# Patient Record
Sex: Female | Born: 1970 | Race: White | Hispanic: Yes | Marital: Married | Smoking: Never smoker
Health system: Southern US, Community
[De-identification: ages and names within clinical notes are randomized; demographics above are authoritative.]

## PROBLEM LIST (undated history)

## (undated) DIAGNOSIS — I10 Essential (primary) hypertension: Secondary | ICD-10-CM

## (undated) DIAGNOSIS — D649 Anemia, unspecified: Secondary | ICD-10-CM

## (undated) DIAGNOSIS — R569 Unspecified convulsions: Secondary | ICD-10-CM

## (undated) DIAGNOSIS — O24419 Gestational diabetes mellitus in pregnancy, unspecified control: Secondary | ICD-10-CM

## (undated) HISTORY — PX: BREAST SURGERY: SHX581

## (undated) HISTORY — DX: Essential (primary) hypertension: I10

## (undated) HISTORY — PX: LIPOSUCTION: SHX10

## (undated) HISTORY — DX: Gestational diabetes mellitus in pregnancy, unspecified control: O24.419

---

## 2003-07-05 ENCOUNTER — Emergency Department (HOSPITAL_COMMUNITY): Admission: EM | Admit: 2003-07-05 | Discharge: 2003-07-05 | Payer: Self-pay | Admitting: *Deleted

## 2005-06-18 ENCOUNTER — Emergency Department (HOSPITAL_COMMUNITY): Admission: EM | Admit: 2005-06-18 | Discharge: 2005-06-18 | Payer: Self-pay | Admitting: Emergency Medicine

## 2006-02-21 ENCOUNTER — Emergency Department (HOSPITAL_COMMUNITY): Admission: EM | Admit: 2006-02-21 | Discharge: 2006-02-22 | Payer: Self-pay | Admitting: Emergency Medicine

## 2006-08-10 ENCOUNTER — Emergency Department (HOSPITAL_COMMUNITY): Admission: EM | Admit: 2006-08-10 | Discharge: 2006-08-10 | Payer: Self-pay | Admitting: Emergency Medicine

## 2008-09-10 ENCOUNTER — Emergency Department (HOSPITAL_COMMUNITY): Admission: EM | Admit: 2008-09-10 | Discharge: 2008-09-10 | Payer: Self-pay | Admitting: Emergency Medicine

## 2009-12-24 ENCOUNTER — Other Ambulatory Visit: Admission: RE | Admit: 2009-12-24 | Discharge: 2009-12-24 | Payer: Self-pay | Admitting: Gynecology

## 2009-12-24 ENCOUNTER — Ambulatory Visit: Payer: Self-pay | Admitting: Gynecology

## 2009-12-29 ENCOUNTER — Encounter: Admission: RE | Admit: 2009-12-29 | Discharge: 2009-12-29 | Payer: Self-pay | Admitting: Gynecology

## 2010-09-16 LAB — POCT I-STAT, CHEM 8
BUN: 17 mg/dL (ref 6–23)
Calcium, Ion: 1.19 mmol/L (ref 1.12–1.32)
Chloride: 103 mEq/L (ref 96–112)
Creatinine, Ser: 0.8 mg/dL (ref 0.4–1.2)
Glucose, Bld: 104 mg/dL — ABNORMAL HIGH (ref 70–99)
HCT: 39 % (ref 36.0–46.0)
Hemoglobin: 13.3 g/dL (ref 12.0–15.0)
Potassium: 4 mEq/L (ref 3.5–5.1)
Sodium: 136 mEq/L (ref 135–145)
TCO2: 25 mmol/L (ref 0–100)

## 2010-09-29 ENCOUNTER — Emergency Department (HOSPITAL_COMMUNITY)
Admission: EM | Admit: 2010-09-29 | Discharge: 2010-09-29 | Disposition: A | Discharge status: Home / Self Care | Payer: No Typology Code available for payment source | Attending: Emergency Medicine | Admitting: Emergency Medicine

## 2010-09-29 DIAGNOSIS — X58XXXA Exposure to other specified factors, initial encounter: Secondary | ICD-10-CM | POA: Insufficient documentation

## 2010-09-29 DIAGNOSIS — H02849 Edema of unspecified eye, unspecified eyelid: Secondary | ICD-10-CM | POA: Insufficient documentation

## 2010-09-29 DIAGNOSIS — T7840XA Allergy, unspecified, initial encounter: Secondary | ICD-10-CM | POA: Insufficient documentation

## 2010-09-29 DIAGNOSIS — L500 Allergic urticaria: Secondary | ICD-10-CM | POA: Insufficient documentation

## 2010-09-30 ENCOUNTER — Emergency Department (HOSPITAL_BASED_OUTPATIENT_CLINIC_OR_DEPARTMENT_OTHER)
Admission: EM | Admit: 2010-09-30 | Discharge: 2010-10-01 | Disposition: A | Discharge status: Home / Self Care | Payer: No Typology Code available for payment source | Attending: Emergency Medicine | Admitting: Emergency Medicine

## 2010-09-30 DIAGNOSIS — R21 Rash and other nonspecific skin eruption: Secondary | ICD-10-CM | POA: Insufficient documentation

## 2010-09-30 DIAGNOSIS — Z79899 Other long term (current) drug therapy: Secondary | ICD-10-CM | POA: Insufficient documentation

## 2011-01-25 ENCOUNTER — Other Ambulatory Visit (HOSPITAL_COMMUNITY)
Admission: RE | Admit: 2011-01-25 | Discharge: 2011-01-25 | Disposition: A | Discharge status: Home / Self Care | Payer: No Typology Code available for payment source | Source: Ambulatory Visit | Attending: Gynecology | Admitting: Gynecology

## 2011-01-25 ENCOUNTER — Encounter: Payer: Self-pay | Admitting: Gynecology

## 2011-01-25 ENCOUNTER — Ambulatory Visit (INDEPENDENT_AMBULATORY_CARE_PROVIDER_SITE_OTHER): Payer: No Typology Code available for payment source | Admitting: Gynecology

## 2011-01-25 VITALS — BP 134/90 | Ht 61.0 in | Wt 202.0 lb

## 2011-01-25 DIAGNOSIS — N393 Stress incontinence (female) (male): Secondary | ICD-10-CM | POA: Insufficient documentation

## 2011-01-25 DIAGNOSIS — F32A Depression, unspecified: Secondary | ICD-10-CM | POA: Insufficient documentation

## 2011-01-25 DIAGNOSIS — F3289 Other specified depressive episodes: Secondary | ICD-10-CM

## 2011-01-25 DIAGNOSIS — Z01419 Encounter for gynecological examination (general) (routine) without abnormal findings: Secondary | ICD-10-CM | POA: Insufficient documentation

## 2011-01-25 DIAGNOSIS — I10 Essential (primary) hypertension: Secondary | ICD-10-CM | POA: Insufficient documentation

## 2011-01-25 DIAGNOSIS — F419 Anxiety disorder, unspecified: Secondary | ICD-10-CM | POA: Insufficient documentation

## 2011-01-25 DIAGNOSIS — F411 Generalized anxiety disorder: Secondary | ICD-10-CM

## 2011-01-25 DIAGNOSIS — F329 Major depressive disorder, single episode, unspecified: Secondary | ICD-10-CM | POA: Insufficient documentation

## 2011-01-25 MED ORDER — PAROXETINE HCL 20 MG PO TABS: 20.0000 mg | ORAL_TABLET | ORAL | Status: DC

## 2011-01-25 MED ORDER — HYDROCHLOROTHIAZIDE 12.5 MG PO CAPS: 25.0000 mg | ORAL_CAPSULE | ORAL | Status: DC

## 2011-01-25 NOTE — Progress Notes (Signed)
Alison Collins 04/07/1971 [id number]   History:    40 y.o.  for annual exam with several complaints today. She had been followed by Dr. Yetta Barre for her hypertension and had labs drawn approximately 6 months ago. When I saw patient last year she was found to be hypertensive and I started her on HCTZ 12.5 mg daily. She stated that Dr. Yetta Barre had placed her on another antihypertensive agent that cost her to be very sleepy and she is not taking this medication or followed up with him. Her mammogram was in July of last year and she is in the process of scheduling her mammogram the next few weeks and she states that she does her monthly self breast examinations. Her other main complaint was stress urinary incontinence when she coughs sneezes or jumps. She denies any nocturia. Her other complaint was decreased libido.  Past medical history,surgical history, family history and social history were all reviewed and documented in the EPIC chart. ROS:  Was performed and pertinent positives and negatives are included in the history.  Exam: chaperone present Filed Vitals:   01/25/11 1612  BP: 134/90   Body mass index is 38.17 kg/(m^2).  General appearance : Well developed well nourished female. Skin grossly normal HEENT: Neck supple, trachea midline Lungs: Clear to auscultation, no rhonchi or wheezes Heart: Regular rate and rhythm, no murmurs or gallops Breast:Examined in sitting and supine position were symmetrical in appearance, no palpable masses, to skin retraction, no nipple inversion, no nipple discharge and no axillary or supraclavicular lymphadenopathy Abdomen: no palpable masses or tenderness Pelvic  Ext/BUS/vagina  normal   Cervix  normal   Uterus  anteverted, normal size, shape and contour, midline and mobile nontender   Adnexa  Without masses or tenderness  Anus and perineum  normal   Rectovaginal  normal sphincter tone without palpated masses or tenderness             Hemoccult not done      Assessment/Plan:  40 y.o. female for annual exam was main complaint today was stress urinary incontinence. We did a q. tip angle tests which was greater than 30 and on Valsalva maneuver she leaked urine in the supine position. There was no evidence of cystocele or rectocele or uterine prolapse. Her only child was delivered vaginally and according to her was for large for gestational age and difficult birth. We will have her return to the office in one to 2 weeks for a urodynamic evaluation. Prescription refill for her Paxil was provided to help her with her anxiety and depression which she states is were tremendously. I'm going to increase her HCTZ from 12.5-25 mg daily and we'll refer her to an internist Dr. Porfirio Oar since she sits states it's too far to drive to see Dr. Yetta Barre. She will be scheduled her mammogram which is overdue. I have encouraged her to exercise 45 minutes to an hour 3 times a week and to take a look at her diet which should be low in fat high in protein, fiber, fruits, and vegetables. For decreased libido she may purchase over-the-counter Zestra which she can apply on the clitoris when necessary. Her Pap smear was done today and we'll follow accordingly.    [name] MD, 5:20 PM 01/25/2011

## 2011-01-25 NOTE — Patient Instructions (Signed)
Nettie Elm, I'm going to refer you to my colleague Dr. Porfirio Oar who is in excellent internus to fall you for your hypertension. I have increased year HCTZ from 12.5 to 25 mg daily. He should be engage in some form of exercise 30-45 minutes 3-4 times a week and also your diet should  consists of a low-fat diet high in fiber high in protein high in fruits and vegetables. We are going to see you in one to 2 weeks to proceed with the urodynamic evaluation for your stress urinary incontinence to determine which is the best management to address your complaint. You may purchase over-the-counter clitoral stimulant such as Zestra which requires no prescription. I did call in your Paxil 20 mg that has worked well for your depression and anxiety. Once the urodynamic evaluation is completed I will refer you to a plastic surgeon for revision of your abdominoplasty/liposuction. No lab work was done today due to the fact the signature other primary physician had done on your labs in less than 6 months ago.

## 2011-03-02 ENCOUNTER — Ambulatory Visit: Payer: No Typology Code available for payment source | Admitting: Internal Medicine

## 2011-03-23 ENCOUNTER — Telehealth: Payer: Self-pay | Admitting: *Deleted

## 2011-03-23 NOTE — Telephone Encounter (Signed)
Lm for pt to call back to schedule urodynamics.

## 2011-04-01 ENCOUNTER — Ambulatory Visit: Payer: No Typology Code available for payment source | Admitting: Internal Medicine

## 2011-04-01 DIAGNOSIS — Z0289 Encounter for other administrative examinations: Secondary | ICD-10-CM

## 2011-04-13 NOTE — Telephone Encounter (Signed)
Pt never called back so therefore closed encounter

## 2011-07-01 ENCOUNTER — Ambulatory Visit: Payer: No Typology Code available for payment source | Admitting: Gynecology

## 2011-07-01 ENCOUNTER — Other Ambulatory Visit: Payer: Self-pay | Admitting: Gynecology

## 2011-07-01 DIAGNOSIS — Z1231 Encounter for screening mammogram for malignant neoplasm of breast: Secondary | ICD-10-CM

## 2011-07-14 ENCOUNTER — Ambulatory Visit
Admission: RE | Admit: 2011-07-14 | Discharge: 2011-07-14 | Disposition: A | Discharge status: Home / Self Care | Payer: No Typology Code available for payment source | Source: Ambulatory Visit | Attending: Gynecology | Admitting: Gynecology

## 2011-07-14 DIAGNOSIS — Z1231 Encounter for screening mammogram for malignant neoplasm of breast: Secondary | ICD-10-CM

## 2011-07-15 ENCOUNTER — Ambulatory Visit (INDEPENDENT_AMBULATORY_CARE_PROVIDER_SITE_OTHER): Payer: No Typology Code available for payment source | Admitting: Gynecology

## 2011-07-15 ENCOUNTER — Encounter: Payer: Self-pay | Admitting: Gynecology

## 2011-07-15 VITALS — BP 148/82

## 2011-07-15 DIAGNOSIS — N393 Stress incontinence (female) (male): Secondary | ICD-10-CM

## 2011-07-15 NOTE — Progress Notes (Signed)
Patient is a 41 year old gravida 3 para 1 AB 2 that presented to the office today with complaining of worsening stress urinary incontinence. She denies any nocturia. She does have frequency but she does drink lots of fluid and she is on HCTZ for hypertension. She is also taking Prozac which has helped her tremendously. She is overweight. She states that when she coughs or sneezes she leaks urine especially if she hasn't voided. She doesn't have any urgency.  Exam: Abdomen: Soft nontender pendulous no rebound or guarding Pelvic: Bartholin urethra Skene was within normal limits Vagina: No lesions or discharge no cystocele or rectocele Uterus: Slightly anteverted normal size shape and consistency Adnexa: No palpable masses or tenderness Rectal: Not examined  Q-tip angle test greater than 30. No evidence of cystocele rectocele. Positive stress test on Valsalva with leakage of urine. Patient was examined in sitting and supine position good support otherwise.  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 Literature information on stress urinary incontinence was provided today. All questions answered we'll follow accordingly.

## 2011-07-15 NOTE — Patient Instructions (Signed)
Will call you with appointment for urodynamic study

## 2011-08-03 ENCOUNTER — Ambulatory Visit: Payer: No Typology Code available for payment source

## 2011-08-10 ENCOUNTER — Ambulatory Visit (INDEPENDENT_AMBULATORY_CARE_PROVIDER_SITE_OTHER): Payer: No Typology Code available for payment source | Admitting: *Deleted

## 2011-08-10 DIAGNOSIS — N3941 Urge incontinence: Secondary | ICD-10-CM

## 2011-08-10 DIAGNOSIS — N393 Stress incontinence (female) (male): Secondary | ICD-10-CM

## 2011-08-17 ENCOUNTER — Ambulatory Visit: Payer: No Typology Code available for payment source | Admitting: Gynecology

## 2011-08-24 ENCOUNTER — Encounter: Payer: Self-pay | Admitting: Gynecology

## 2011-08-24 ENCOUNTER — Telehealth: Payer: Self-pay | Admitting: *Deleted

## 2011-08-24 ENCOUNTER — Ambulatory Visit (INDEPENDENT_AMBULATORY_CARE_PROVIDER_SITE_OTHER): Payer: No Typology Code available for payment source | Admitting: Gynecology

## 2011-08-24 VITALS — BP 144/88

## 2011-08-24 DIAGNOSIS — N393 Stress incontinence (female) (male): Secondary | ICD-10-CM

## 2011-08-24 NOTE — Telephone Encounter (Signed)
Lm for patient to call.  Need to inform patient [facility] said they don't accept her insurance, but could file for her and she may be billed for it if they don't pay.  Need to get ok from patient before scheduling appt.

## 2011-08-24 NOTE — Progress Notes (Signed)
Patient is a 41 year old gravida 3 para 1 AB 2 who was seen in the office on February 7 for her annual gynecological examination and had voiced her concern about her worsening stress urinary incontinence. She denied any urgency or any nocturia. She's currently on HCTZ for hypertension. She states that when she coughs or sneeze she'll leak urine. Patient had a urodynamic evaluation here in our office on March 5 with the following results:  Complex uroflowmetry was normal and demonstrated no evidence of obstructive uropathy. Her post void residual was normal at 40 cc. Neither genuine stress urinary incontinence nor uninhibited detrusor activity was noted during a multichannel CMG. 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 Leak point pressures were at 167, 109, and 163 cm of water. The patient's cystometric bladder capacity was normal as was her bladder compliance. Maximum urethral closure pressures were within normal range as follows 50, 34 and 43 cm of water. Which were within normal range and failed to demonstrate intrinsic sphincter deficiency. 3 channel CMG with flow was within normal limits.  Case was discussed with Dr. Patsi Sears (urologist) who agrees that patient stress urinary incontinence will be improved with the Linx retropubic sling procedure. Patient has no cystocele or rectocele or uterine prolapse. Since patient is very active and does weight lifting this form of anti-incontinence procedure would be best for her and he would be able to perform it. An appointment will be set up for her to see him preoperatively. We will send him a copy of this note as well as a urodynamic studies. Literature information was provided.

## 2011-08-24 NOTE — Patient Instructions (Signed)
Urinary Incontinence Your doctor wants you to have this information about urinary incontinence. This is the inability to keep urine in your body until you decide to release it. CAUSES  Prostate gland enlargement is a common cause of urinary incontinence. But there are many different causes for losing urinary control. They include:  Medicines.   Infections.   Prostate problems.   Surgery.   Neurological diseases.   Emotional factors.  DIAGNOSIS  Evaluating the cause of incontinence is important in choosing the best treatment. This may require:  An ultrasound exam.   Kidney and bladder X-rays.   Cystoscopy. This is an exam of the bladder using a narrow scope.  TREATMENT  For incontinent patients, normal daily hygiene and using changing pads or adult diapers regularly will prevent offensive odors and skin damage from the moisture. Changing your medicines may help control incontinence. Your caregiver may prescribe some medicines to help you regain control. Avoid caffeine. It can over-stimulate the bladder. Use the bathroom regularly. Try about every 2 to 3 hours even if you do not feel the need. Take time to empty your bladder completely. After urinating, wait a minute. Then try to urinate again. External devices used to catch urine or an indwelling urine catheter (Foley catheter) may be needed as well. Some prostate gland problems require surgery to correct. Call your caregiver for more information. Document Released: 07/01/2004 Document Revised: 05/13/2011 Document Reviewed: 06/26/2008 [facility] Patient Information 2012 [place], [place].  Urethral Vaginal Sling A urethral vaginal sling can be done to correct urinary incontinence (UI). Urinary incontinence is uncontrolled loss of urine. It is very common in both women who have had children and in aging women. The purpose of the "urethral vaginal sling" is to place a strong piece of material (i.e., tension-free vaginal tape or nylon mesh)  that fits under the urethra like a hammock. The urethra is the tube that drains your bladder. This sling is put in position to straighten, support and hold the urethra in its normal position. This is a common surgical procedure for this problem. LET YOUR CAREGIVER KNOW ABOUT:   Any allergies to foods or medications.   All the medications you are taking. This includes over-the-counter drugs, herbs, eye drops, creams and steroids.   Use of illegal drugs.   Smoking or heavy alcohol drinking.   Past problems with anesthetics.   Possibility of being pregnant.   History of blood clots or other blood problems.   History of bleeding problems.   Past surgery.   Other medical or health problems.  RISKS AND COMPLICATIONS   Infection.   Excessive bleeding.   Damage to other organs.   Problems urinating properly for several days or weeks.   Problems from the anesthesia.   The UI can come back.  BEFORE THE PROCEDURE   Do not take aspirin or blood thinners a week before the surgery unless you are told otherwise.   Do not eat or drink anything 8 hours before the surgery.   If you smoke, do not smoke at least two weeks before the surgery.   Let your caregiver know if you get a cold or infection before your surgery.   If you will be admitted the same day as the surgery, get to the hospital at least one hour before the surgery.   Arrange for someone to take you home from the hospital and for help at home.  AFTER THE PROCEDURE   You will be taken to recovery area where nurses  will monitor your progress and vital signs (blood pressure, pulse, breathing, temperature). They will move you to your room when you are stable.   You will have a catheter in your bladder until you can urinate properly.   You may have a gauze packing in the vagina to prevent bleeding. This will be removed in one to two days.   You are usually in the hospital 2 to 3 days.  HOME CARE INSTRUCTIONS   Take  showers instead of baths until you are informed otherwise.   Resume your usual diet.   Get rest and sleep.   Try to have someone at home for 2 to 3 weeks to help you with your household activities.   Do not lift anything over 5 pounds.   Only take over-the-counter or prescription medicines for pain, discomfort or fever as directed by your caregiver. Do not take aspirin because it can cause bleeding.   Do not douche, exercise, use tampons or have sexual intercourse until your caregiver gives you permission.  SEEK MEDICAL CARE IF:   You have a heavy or bad smelling vaginal discharge.   You develop a rash.   You need stronger pain medication.   You are having side effects from your medications.   You develop lightheadedness or feel faint.  SEEK IMMEDIATE MEDICAL CARE IF:   You develop a temperature of 100 F (37.8 C) or higher.   You have vaginal bleeding.   You faint or pass out.   You develop shortness of breath.   You develop chest, abdominal or leg pain.   You develop pain when urinating or cannot urinate.   Your catheter is still in your bladder and it blocks up.   You develop swelling, redness and pain in the vaginal area.  Document Released: 03/02/2008 Document Revised: 05/13/2011 Document Reviewed: 03/02/2008 [facility] Patient Information 2012 [place], [place].

## 2011-08-24 NOTE — Telephone Encounter (Signed)
Message copied by Libby Maw on Tue Aug 24, 2011  3:17 PM ------      Message from: [name]      Created: Tue Aug 24, 2011  2:08 PM       Shae Hinnenkamp, I spoke with Dr. Patsi Sears this morning and he says he would like you to call his nurse Selena Batten to schedule a consult. Patient diagnoses stress urinary incontinence will need a retropubic sling procedure. See last encounter from today. Thank you

## 2011-08-27 NOTE — Telephone Encounter (Signed)
Lm for patient to call

## 2011-09-08 ENCOUNTER — Encounter: Payer: Self-pay | Admitting: *Deleted

## 2011-10-19 NOTE — Telephone Encounter (Signed)
Patient never responded to letter sent on 09/08/11.

## 2012-02-07 ENCOUNTER — Other Ambulatory Visit: Payer: Self-pay | Admitting: Gynecology

## 2012-12-06 ENCOUNTER — Encounter (HOSPITAL_COMMUNITY): Payer: Self-pay | Admitting: Emergency Medicine

## 2012-12-06 ENCOUNTER — Emergency Department (HOSPITAL_COMMUNITY)
Admission: EM | Admit: 2012-12-06 | Discharge: 2012-12-07 | Disposition: A | Discharge status: Home / Self Care | Payer: Self-pay | Attending: Emergency Medicine | Admitting: Emergency Medicine

## 2012-12-06 DIAGNOSIS — H109 Unspecified conjunctivitis: Secondary | ICD-10-CM | POA: Insufficient documentation

## 2012-12-06 DIAGNOSIS — H539 Unspecified visual disturbance: Secondary | ICD-10-CM | POA: Insufficient documentation

## 2012-12-06 DIAGNOSIS — H5713 Ocular pain, bilateral: Secondary | ICD-10-CM

## 2012-12-06 DIAGNOSIS — Z880 Allergy status to penicillin: Secondary | ICD-10-CM | POA: Insufficient documentation

## 2012-12-06 DIAGNOSIS — H5710 Ocular pain, unspecified eye: Secondary | ICD-10-CM | POA: Insufficient documentation

## 2012-12-06 DIAGNOSIS — Z8632 Personal history of gestational diabetes: Secondary | ICD-10-CM | POA: Insufficient documentation

## 2012-12-06 DIAGNOSIS — I10 Essential (primary) hypertension: Secondary | ICD-10-CM | POA: Insufficient documentation

## 2012-12-06 DIAGNOSIS — L299 Pruritus, unspecified: Secondary | ICD-10-CM | POA: Insufficient documentation

## 2012-12-06 DIAGNOSIS — Z8679 Personal history of other diseases of the circulatory system: Secondary | ICD-10-CM | POA: Insufficient documentation

## 2012-12-06 MED ORDER — FLUORESCEIN SODIUM 1 MG OP STRP
1.0000 | ORAL_STRIP | Freq: Once | OPHTHALMIC | Status: AC
  Administered 2012-12-06: 1 via OPHTHALMIC
  Filled 2012-12-06 (×2): qty 1

## 2012-12-06 MED ORDER — TETRACAINE HCL 0.5 % OP SOLN
2.0000 [drp] | Freq: Once | OPHTHALMIC | Status: AC
  Administered 2012-12-06: 2 [drp] via OPHTHALMIC
  Filled 2012-12-06: qty 2

## 2012-12-06 NOTE — ED Provider Notes (Signed)
This chart was scribed for Alison Collins, a non-physician practitioner working with Olivia Mackie, MD by Lewanda Rife, ED Scribe. This patient was seen in room TR04C/TR04C and the patient's care was started at 2332.     History    CSN: [id number] Arrival date & time 12/06/12  2319  First MD Initiated Contact with Patient 12/06/12 2329     Chief Complaint  Patient presents with  . Eye Drainage   The history is provided by the patient.   HPI Comments: Levern Kalka is a 42 y.o. female who presents to the Emergency Department complaining of worsening constant bilateral eye irritation onset 1 week. Reports associated moderate constant pain redness, teary, "spotty vision" and itching. Reports she does wear make up, but stopped using it all together with no relief of symptoms. Denies associated fever, photophobia, purulent drainage, congestion, sick contacts at home, diplopia, rhinorrhea, and otalgia. 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 Reports trying OTC Visine season itching , Zyrtec, and Zaditor (antihistimine eye drops) with no relief of symptoms. Reports she does not wear eye contacts. Reports hx of seasonal allergies. Denies significant PMH.  Past Medical History  Diagnosis Date  . GDM (gestational diabetes mellitus)   . Normal spontaneous vaginal delivery   . Spontaneous abortion     2  . Hypertension   . Migraine    Past Surgical History  Procedure Laterality Date  . Liposuction     Family History  Problem Relation Age of Onset  . Hypertension Mother   . Heart disease Father    History  Substance Use Topics  . Smoking status: Never Smoker   . Smokeless tobacco: Never Used  . Alcohol Use: Yes     Comment: OCCASIONALLY   OB History   Grav Para Term Preterm Abortions TAB SAB Ect Mult Living   3 1   2  2   1      Review of Systems  Constitutional: Negative for fever.  HENT: Negative for rhinorrhea.   Eyes: Positive for pain,  discharge, redness, itching and visual disturbance. Negative for photophobia.  Allergic/Immunologic: Positive for environmental allergies.  A complete 10 system review of systems was obtained and all systems are negative except as noted in the HPI and PMH.     Allergies  Penicillins  Home Medications   Current Outpatient Rx  Name  Route  Sig  Dispense  Refill  . hydrochlorothiazide (,MICROZIDE/HYDRODIURIL,) 12.5 MG capsule   Oral   Take 2 capsules (25 mg total) by mouth every morning.   60 capsule   10   . PARoxetine (PAXIL) 20 MG tablet      TAKE 1 TABLET (20 MG TOTAL) BY MOUTH EVERY MORNING.   30 tablet   8    BP 153/81  Pulse 78  Temp(Src) 98.1 F (36.7 C) (Oral)  Resp 14  SpO2 98%  LMP 11/24/2012 Physical Exam  Nursing note and vitals reviewed. Constitutional: She is oriented to person, place, and time. She appears well-developed and well-nourished. No distress.  HENT:  Head: Normocephalic and atraumatic.  Eyes: EOM are normal. Pupils are equal, round, and reactive to light. Right eye exhibits no discharge and no exudate. Left eye exhibits no discharge and no exudate. Right conjunctiva is injected. Left conjunctiva is injected. No scleral icterus.  Slit lamp exam:      The right eye shows fluorescein uptake. The right eye shows no corneal abrasion, no corneal flare,  no corneal ulcer and no foreign body.       The left eye shows fluorescein uptake. The left eye shows no corneal abrasion, no corneal flare, no corneal ulcer and no foreign body.  Patient without photophobia. No photophobia with both direct and indirect lights.  Very small pinpoint areas of fluorescein uptake over the cornea bilaterally. No corneal abrasions or ulcerations. No dendritic branchings.  Neck: Neck supple. No tracheal deviation present.  Cardiovascular: Normal rate.   Pulmonary/Chest: Effort normal. No respiratory distress.  Musculoskeletal: Normal range of motion.  Neurological: She is  alert and oriented to person, place, and time.  Skin: Skin is warm and dry.  Psychiatric: She has a normal mood and affect. Her behavior is normal.    ED Course  Procedures     1. Eye pain, bilateral   2. Conjunctivitis     MDM  Patient seen and evaluated. Patient appears well in no acute distress.  Patient with some symptoms of itchiness and irritation possibly consistent with allergic conjunctivitis however she is not having any good relief with Zyrtec and over-the-counter allergy eyedrops. There are small pinpoint areas of fluorescein uptake over the corneas bilaterally. Maybe some concerns for additional infection. She does not seem to have significant photophobia consistent with iritis. We'll plan to provide antibiotic eye drop and give ophthalmology referral.    I personally performed the services described in this documentation, which was scribed in my presence. The recorded information has been reviewed and is accurate.   Angus Seller, PA-C 12/07/12 [phone]

## 2012-12-06 NOTE — ED Notes (Signed)
PT. REPORTS PERSISTENT BILATERAL EYE IRRITATION WITH REDDNESS/ WATERY / ITCHY EYES FOR 1 WEEK .

## 2012-12-07 MED ORDER — HYDROCODONE-ACETAMINOPHEN 5-325 MG PO TABS
1.0000 | ORAL_TABLET | Freq: Once | ORAL | Status: AC
  Administered 2012-12-07: 1 via ORAL
  Filled 2012-12-07: qty 1

## 2012-12-07 MED ORDER — HYDROCODONE-ACETAMINOPHEN 5-325 MG PO TABS: 1.0000 | ORAL_TABLET | ORAL | Status: DC | PRN

## 2012-12-07 MED ORDER — TOBRAMYCIN 0.3 % OP SOLN
2.0000 [drp] | OPHTHALMIC | Status: DC
  Administered 2012-12-07: 2 [drp] via OPHTHALMIC
  Filled 2012-12-07: qty 5

## 2012-12-07 NOTE — ED Notes (Signed)
Pt comfortable with d/c and f/u instructions. Prescriptions x1 

## 2012-12-07 NOTE — ED Provider Notes (Signed)
Medical screening examination/treatment/procedure(s) were performed by non-physician practitioner and as supervising physician I was immediately available for consultation/collaboration.  Zephyr Ridley M Kori Colin, MD 12/07/12 0412 

## 2012-12-11 ENCOUNTER — Emergency Department (INDEPENDENT_AMBULATORY_CARE_PROVIDER_SITE_OTHER)
Admission: EM | Admit: 2012-12-11 | Discharge: 2012-12-11 | Disposition: A | Discharge status: Home / Self Care | Payer: Self-pay | Source: Home / Self Care | Attending: Emergency Medicine | Admitting: Emergency Medicine

## 2012-12-11 ENCOUNTER — Encounter (HOSPITAL_COMMUNITY): Payer: Self-pay | Admitting: Emergency Medicine

## 2012-12-11 DIAGNOSIS — H10219 Acute toxic conjunctivitis, unspecified eye: Secondary | ICD-10-CM

## 2012-12-11 DIAGNOSIS — H10213 Acute toxic conjunctivitis, bilateral: Secondary | ICD-10-CM

## 2012-12-11 MED ORDER — KETOTIFEN FUMARATE 0.025 % OP SOLN: 1.0000 [drp] | Freq: Two times a day (BID) | OPHTHALMIC | Status: AC

## 2012-12-11 MED ORDER — TETRACAINE HCL 0.5 % OP SOLN
OPHTHALMIC | Status: AC
  Filled 2012-12-11: qty 2
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
## 2012-12-11 NOTE — ED Notes (Addendum)
Reports eyes hurting, vision blurry,  feeling something in eyes, and watery eyes that started one week ago.  Patient reports going to emergency department 2-3 days ago and receiving eye drops -tobramycin- reports using drops as instructed , but feeling no better.  Does not wear contacts, wears glasses with driving

## 2012-12-11 NOTE — ED Notes (Signed)
Eye box, tetracaine at bedside

## 2012-12-11 NOTE — ED Notes (Signed)
Discussed work note with dr Ladon Applebaum

## 2012-12-13 ENCOUNTER — Encounter (HOSPITAL_BASED_OUTPATIENT_CLINIC_OR_DEPARTMENT_OTHER): Payer: Self-pay | Admitting: Family Medicine

## 2012-12-13 ENCOUNTER — Emergency Department (HOSPITAL_BASED_OUTPATIENT_CLINIC_OR_DEPARTMENT_OTHER)
Admission: EM | Admit: 2012-12-13 | Discharge: 2012-12-13 | Disposition: A | Discharge status: Home / Self Care | Payer: Worker's Compensation | Attending: Emergency Medicine | Admitting: Emergency Medicine

## 2012-12-13 DIAGNOSIS — H10213 Acute toxic conjunctivitis, bilateral: Secondary | ICD-10-CM

## 2012-12-13 DIAGNOSIS — H5789 Other specified disorders of eye and adnexa: Secondary | ICD-10-CM | POA: Insufficient documentation

## 2012-12-13 DIAGNOSIS — Z8632 Personal history of gestational diabetes: Secondary | ICD-10-CM | POA: Insufficient documentation

## 2012-12-13 DIAGNOSIS — Z8679 Personal history of other diseases of the circulatory system: Secondary | ICD-10-CM | POA: Insufficient documentation

## 2012-12-13 DIAGNOSIS — H10219 Acute toxic conjunctivitis, unspecified eye: Secondary | ICD-10-CM | POA: Insufficient documentation

## 2012-12-13 DIAGNOSIS — I10 Essential (primary) hypertension: Secondary | ICD-10-CM | POA: Insufficient documentation

## 2012-12-13 DIAGNOSIS — Z880 Allergy status to penicillin: Secondary | ICD-10-CM | POA: Insufficient documentation

## 2012-12-13 NOTE — ED Notes (Signed)
Pt employer with pt and sts pt had to be brought to ED due to workers comp issue. Pt has not consulted with opthalmology.

## 2012-12-13 NOTE — ED Provider Notes (Signed)
History    CSN: [id number] Arrival date & time 12/13/12  1433  First MD Initiated Contact with Patient 12/13/12 1452     Chief Complaint  Patient presents with  . Eye Problem   (Consider location/radiation/quality/duration/timing/severity/associated sxs/prior Treatment) HPI  42 year old female presents to ED requesting for evaluations of eye pain and redness.  Patient reports 2 weeks ago she started to notice some irritation to both eyes while at work. 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 She was initially evaluated in the ED for the symptoms. Her exam reveals no evidence of corneal abrasion however patient was prescribed pain medication and antibiotic eyedrops. She continues to take the medication with improvement however (has not 4 resolved. She was seen at urgent care 2 days ago for the same complaints. She was diagnosed with chemical conjunctivitis and was giving pain medication eyedrop which has helped the symptoms. She was recommended to followup with an ophthalmologist if symptoms not fully resolved. She went to work today, however her eye are red, therefore she was suggested to come to the ED for reevaluation and also to filled out worker's comp.  Pt has not f/u with opthalmologist yet.  Sts her sxs has improved.  Denies any active eye pain or worsening vision.  Denies pressure behind eye, headache, n/v, or rash.  Denies recent trauma.   Past Medical History  Diagnosis Date  . GDM (gestational diabetes mellitus)   . Normal spontaneous vaginal delivery   . Spontaneous abortion     2  . Hypertension   . Migraine    Past Surgical History  Procedure Laterality Date  . Liposuction     Family History  Problem Relation Age of Onset  . Hypertension Mother   . Heart disease Father    History  Substance Use Topics  . Smoking status: Never Smoker   . Smokeless tobacco: Never Used  . Alcohol Use: Yes     Comment: OCCASIONALLY    OB History   Grav Para Term Preterm Abortions TAB SAB Ect Mult Living   3 1   2  2   1      Review of Systems  Constitutional: Negative for fever.  Eyes: Positive for redness. Negative for photophobia, pain, discharge, itching and visual disturbance.  Neurological: Negative for headaches.    Allergies  Penicillins  Home Medications   Current Outpatient Rx  Name  Route  Sig  Dispense  Refill  . HYDROcodone-acetaminophen (NORCO) 5-325 MG per tablet   Oral   Take 1 tablet by mouth every 4 (four) hours as needed for pain.   20 tablet   0   . ketotifen (ZADITOR) 0.025 % ophthalmic solution   Both Eyes   Place 1 drop into both eyes 2 (two) times daily.   5 mL   0    BP 168/95  Pulse 106  Temp(Src) 98.4 F (36.9 C) (Oral)  Resp 20  SpO2 96%  LMP 11/24/2012 Physical Exam  Nursing note and vitals reviewed. Constitutional: She is oriented to person, place, and time. She appears well-developed and well-nourished. No distress.  HENT:  Head: Atraumatic.  Eyes: EOM are normal. Pupils are equal, round, and reactive to light. Right eye exhibits no discharge. Left eye exhibits no discharge. No scleral icterus.  Bilateral eyes with injected conjunctiva.  PERRL, EOMI.  No fb seen or palpated.  Non tender on palpation.  No edema of eyelid  Neck: Neck supple.  Neurological: She is alert and oriented to person, place, and time.  Skin: Skin is warm. No rash noted.  Psychiatric: She has a normal mood and affect.    ED Course  Procedures (including critical care time)  3:27 PM Pt with injected conjunctiva bilaterally.  Visual acuity similar to 2 days ago.  Pt has had eye evaluation including woods lamp, fluorescein without acute finding.  Pt has also been prescribed abx and eye drops and sxs has improved.  She is here mainly due to worker's comp requirement.  Will obtain labs as requested and pt to f/u with opthalmologist for further care.   Pt otherwise stable for discharge.     Labs Reviewed - No data to display No results found. 1. Chemical conjunctivitis of both eyes     MDM  BP 168/95  Pulse 106  Temp(Src) 98.4 F (36.9 C) (Oral)  Resp 20  SpO2 96%  LMP 11/24/2012   Fayrene Helper, PA-C 12/13/12 4132

## 2012-12-14 NOTE — ED Provider Notes (Signed)
Medical screening examination/treatment/procedure(s) were performed by non-physician practitioner and as supervising physician I was immediately available for consultation/collaboration.   Apolinar Bero B. Bernette Mayers, MD 12/14/12 1104

## 2014-03-09 ENCOUNTER — Emergency Department (HOSPITAL_COMMUNITY)
Admission: EM | Admit: 2014-03-09 | Discharge: 2014-03-09 | Disposition: A | Discharge status: Home / Self Care | Payer: No Typology Code available for payment source | Attending: Emergency Medicine | Admitting: Emergency Medicine

## 2014-03-09 ENCOUNTER — Encounter (HOSPITAL_COMMUNITY): Payer: Self-pay | Admitting: Emergency Medicine

## 2014-03-09 DIAGNOSIS — Z880 Allergy status to penicillin: Secondary | ICD-10-CM | POA: Insufficient documentation

## 2014-03-09 DIAGNOSIS — Z8669 Personal history of other diseases of the nervous system and sense organs: Secondary | ICD-10-CM | POA: Insufficient documentation

## 2014-03-09 DIAGNOSIS — Y92410 Unspecified street and highway as the place of occurrence of the external cause: Secondary | ICD-10-CM | POA: Insufficient documentation

## 2014-03-09 DIAGNOSIS — S20212A Contusion of left front wall of thorax, initial encounter: Secondary | ICD-10-CM | POA: Insufficient documentation

## 2014-03-09 DIAGNOSIS — I10 Essential (primary) hypertension: Secondary | ICD-10-CM | POA: Diagnosis not present

## 2014-03-09 DIAGNOSIS — Z8632 Personal history of gestational diabetes: Secondary | ICD-10-CM | POA: Insufficient documentation

## 2014-03-09 DIAGNOSIS — Y9389 Activity, other specified: Secondary | ICD-10-CM | POA: Diagnosis not present

## 2014-03-09 DIAGNOSIS — S199XXA Unspecified injury of neck, initial encounter: Secondary | ICD-10-CM | POA: Diagnosis present

## 2014-03-09 DIAGNOSIS — S161XXA Strain of muscle, fascia and tendon at neck level, initial encounter: Secondary | ICD-10-CM | POA: Diagnosis not present

## 2014-03-09 MED ORDER — METHOCARBAMOL 500 MG PO TABS: 500.0000 mg | ORAL_TABLET | Freq: Two times a day (BID) | ORAL | Status: DC

## 2014-03-09 MED ORDER — IBUPROFEN 600 MG PO TABS: 600.0000 mg | ORAL_TABLET | Freq: Four times a day (QID) | ORAL | Status: DC | PRN

## 2014-03-09 MED ORDER — IBUPROFEN 200 MG PO TABS
600.0000 mg | ORAL_TABLET | Freq: Once | ORAL | Status: AC
  Administered 2014-03-09: 600 mg via ORAL
  Filled 2014-03-09: qty 3

## 2014-03-09 MED ORDER — METHOCARBAMOL 500 MG PO TABS
750.0000 mg | ORAL_TABLET | Freq: Once | ORAL | Status: AC
  Administered 2014-03-09: 750 mg via ORAL
  Filled 2014-03-09: qty 2

## 2014-03-09 NOTE — ED Provider Notes (Signed)
Medical screening examination/treatment/procedure(s) were performed by non-physician practitioner and as supervising physician I was immediately available for consultation/collaboration.   EKG Interpretation None        Vauda Salvucci, MD 03/09/14 2305 

## 2014-03-09 NOTE — Discharge Instructions (Signed)
Blunt Chest Trauma Blunt chest trauma is an injury caused by a blow to the chest. These chest injuries can be very painful. Blunt chest trauma often results in bruised or broken (fractured) ribs. Most cases of bruised and fractured ribs from blunt chest traumas get better after 1 to 3 weeks of rest and pain medicine. Often, the soft tissue in the chest wall is also injured, causing pain and bruising. Internal organs, such as the heart and lungs, may also be injured. Blunt chest trauma can lead to serious medical problems. This injury requires immediate medical care. CAUSES   Motor vehicle collisions.  Falls.  Physical violence.  Sports injuries. SYMPTOMS   Chest pain. The pain may be worse when you move or breathe deeply.  Shortness of breath.  Lightheadedness.  Bruising.  Tenderness.  Swelling. DIAGNOSIS  Your caregiver will do a physical exam. X-rays may be taken to look for fractures. However, minor rib fractures may not show up on X-rays until a few days after the injury. If a more serious injury is suspected, further imaging tests may be done. This may include ultrasounds, computed tomography (CT) scans, or magnetic resonance imaging (MRI). TREATMENT  Treatment depends on the severity of your injury. Your caregiver may prescribe pain medicines and deep breathing exercises. HOME CARE INSTRUCTIONS  Limit your activities until you can move around without much pain.  Do not do any strenuous work until your injury is healed.  Put ice on the injured area.  Put ice in a plastic bag.  Place a towel between your skin and the bag.  Leave the ice on for 15-20 minutes, 03-04 times a day.  You may wear a rib belt as directed by your caregiver to reduce pain.  Practice deep breathing as directed by your caregiver to keep your lungs clear.  Only take over-the-counter or prescription medicines for pain, fever, or discomfort as directed by your caregiver. SEEK IMMEDIATE MEDICAL  CARE IF:   You have increasing pain or shortness of breath.  You cough up blood.  You have nausea, vomiting, or abdominal pain.  You have a fever.  You feel dizzy, weak, or you faint. MAKE SURE YOU:  Understand these instructions.  Will watch your condition.  Will get help right away if you are not doing well or get worse. Document Released: 07/01/2004 Document Revised: 08/16/2011 Document Reviewed: 03/10/2011 [facility] Patient Information 2015 [place], [place]. This information is not intended to replace advice given to you by your health care provider. Make sure you discuss any questions you have with your health care provider.  Cervical Sprain A cervical sprain is when the tissues (ligaments) that hold the neck bones in place stretch or tear. HOME CARE   Put ice on the injured area.  Put ice in a plastic bag.  Place a towel between your skin and the bag.  Leave the ice on for 15-20 minutes, 3-4 times a day.  You may have been given a collar to wear. This collar keeps your neck from moving while you heal.  Do not take the collar off unless told by your doctor.  If you have long hair, keep it outside of the collar.  Ask your doctor before changing the position of your collar. You may need to change its position over time to make it more comfortable.  If you are allowed to take off the collar for cleaning or bathing, follow your doctor's instructions on how to do it safely.  Keep your collar  clean by wiping it with mild soap and water. Dry it completely. If the collar has removable pads, remove them every 1-2 days to hand wash them with soap and water. Allow them to air dry. They should be dry before you wear them in the collar.  Do not drive while wearing the collar.  Only take medicine as told by your doctor.  Keep all doctor visits as told.  Keep all physical therapy visits as told.  Adjust your work station so that you have good posture while you work.  Avoid  positions and activities that make your problems worse.  Warm up and stretch before being active. GET HELP IF:  Your pain is not controlled with medicine.  You cannot take less pain medicine over time as planned.  Your activity level does not improve as expected. GET HELP RIGHT AWAY IF:   You are bleeding.  Your stomach is upset.  You have an allergic reaction to your medicine.  You develop new problems that you cannot explain.  You lose feeling (become numb) or you cannot move any part of your body (paralysis).  You have tingling or weakness in any part of your body.  Your symptoms get worse. Symptoms include:  Pain, soreness, stiffness, puffiness (swelling), or a burning feeling in your neck.  Pain when your neck is touched.  Shoulder or upper back pain.  Limited ability to move your neck.  Headache.  Dizziness.  Your hands or arms feel week, lose feeling, or tingle.  Muscle spasms.  Difficulty swallowing or chewing. MAKE SURE YOU:   Understand these instructions.  Will watch your condition.  Will get help right away if you are not doing well or get worse. Document Released: 11/10/2007 Document Revised: 01/24/2013 Document Reviewed: 11/29/2012 [facility] Patient Information 2015 [place]. This information is not intended to replace advice given to you by your health care provider. Make sure you discuss any questions you have with your health care provider. You can expect to be sore of the next several days to a week  Please take the prescribed medication as directed If you develop new symptoms or your present symptoms become unmanageable at home please return for further evaluation
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
## 2014-03-12 ENCOUNTER — Encounter (HOSPITAL_COMMUNITY): Payer: Self-pay | Admitting: Emergency Medicine

## 2014-03-12 ENCOUNTER — Emergency Department (HOSPITAL_COMMUNITY)
Admission: EM | Admit: 2014-03-12 | Discharge: 2014-03-12 | Disposition: A | Discharge status: Home / Self Care | Payer: No Typology Code available for payment source | Attending: Emergency Medicine | Admitting: Emergency Medicine

## 2014-03-12 ENCOUNTER — Emergency Department (HOSPITAL_COMMUNITY): Payer: No Typology Code available for payment source

## 2014-03-12 DIAGNOSIS — S138XXA Sprain of joints and ligaments of other parts of neck, initial encounter: Secondary | ICD-10-CM | POA: Diagnosis not present

## 2014-03-12 DIAGNOSIS — S134XXA Sprain of ligaments of cervical spine, initial encounter: Secondary | ICD-10-CM

## 2014-03-12 DIAGNOSIS — Y9289 Other specified places as the place of occurrence of the external cause: Secondary | ICD-10-CM | POA: Diagnosis not present

## 2014-03-12 DIAGNOSIS — Z880 Allergy status to penicillin: Secondary | ICD-10-CM | POA: Diagnosis not present

## 2014-03-12 DIAGNOSIS — Z79899 Other long term (current) drug therapy: Secondary | ICD-10-CM | POA: Insufficient documentation

## 2014-03-12 DIAGNOSIS — S3982XA Other specified injuries of lower back, initial encounter: Secondary | ICD-10-CM | POA: Diagnosis not present

## 2014-03-12 DIAGNOSIS — I10 Essential (primary) hypertension: Secondary | ICD-10-CM | POA: Insufficient documentation

## 2014-03-12 DIAGNOSIS — S199XXD Unspecified injury of neck, subsequent encounter: Secondary | ICD-10-CM | POA: Diagnosis present

## 2014-03-12 DIAGNOSIS — Y92410 Unspecified street and highway as the place of occurrence of the external cause: Secondary | ICD-10-CM | POA: Diagnosis not present

## 2014-03-12 DIAGNOSIS — Z8632 Personal history of gestational diabetes: Secondary | ICD-10-CM | POA: Insufficient documentation

## 2014-03-12 MED ORDER — HYDROCODONE-ACETAMINOPHEN 5-325 MG PO TABS: 1.0000 | ORAL_TABLET | Freq: Four times a day (QID) | ORAL | Status: DC | PRN

## 2014-03-12 NOTE — Discharge Instructions (Signed)
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
## 2014-03-12 NOTE — ED Notes (Signed)
Resident at bedside.  

## 2014-03-13 NOTE — ED Provider Notes (Signed)
Medical screening examination/treatment/procedure(s) were conducted as a shared visit with resident physician and myself.  I personally evaluated the patient during the encounter.   I interviewed and examined the patient. Lungs are CTAB. Cardiac exam wnl. Abdomen soft.  Imaging non-contrib. Pt would like to wear [place] collar for comfort. Will rec f/u w/ her pcp.   Purvis SheffieldForrest Peggy Monk, MD 03/13/14 1157

## 2014-04-08 ENCOUNTER — Encounter (HOSPITAL_COMMUNITY): Payer: Self-pay | Admitting: Emergency Medicine

## 2014-07-26 ENCOUNTER — Emergency Department (HOSPITAL_COMMUNITY)
Admission: EM | Admit: 2014-07-26 | Discharge: 2014-07-26 | Disposition: A | Discharge status: Home / Self Care | Payer: No Typology Code available for payment source | Attending: Emergency Medicine | Admitting: Emergency Medicine

## 2014-07-26 ENCOUNTER — Encounter (HOSPITAL_COMMUNITY): Payer: Self-pay | Admitting: Emergency Medicine

## 2014-07-26 DIAGNOSIS — Z79899 Other long term (current) drug therapy: Secondary | ICD-10-CM | POA: Insufficient documentation

## 2014-07-26 DIAGNOSIS — I10 Essential (primary) hypertension: Secondary | ICD-10-CM | POA: Insufficient documentation

## 2014-07-26 DIAGNOSIS — Z880 Allergy status to penicillin: Secondary | ICD-10-CM | POA: Insufficient documentation

## 2014-07-26 DIAGNOSIS — H109 Unspecified conjunctivitis: Secondary | ICD-10-CM | POA: Insufficient documentation

## 2014-07-26 DIAGNOSIS — Z8632 Personal history of gestational diabetes: Secondary | ICD-10-CM | POA: Insufficient documentation

## 2014-07-26 MED ORDER — TETRACAINE HCL 0.5 % OP SOLN
2.0000 [drp] | Freq: Once | OPHTHALMIC | Status: AC
  Administered 2014-07-26: 2 [drp] via OPHTHALMIC
  Filled 2014-07-26: qty 2

## 2014-07-26 MED ORDER — PREDNISOLONE ACETATE 1 % OP SUSP: 2.0000 [drp] | Freq: Four times a day (QID) | OPHTHALMIC | Status: DC

## 2014-07-26 MED ORDER — LORATADINE 10 MG PO TABS: 10.0000 mg | ORAL_TABLET | Freq: Every day | ORAL | Status: DC

## 2014-07-26 MED ORDER — FLUORESCEIN SODIUM 1 MG OP STRP
1.0000 | ORAL_STRIP | Freq: Once | OPHTHALMIC | Status: AC
  Administered 2014-07-26: 1 via OPHTHALMIC
  Filled 2014-07-26: qty 1

## 2014-07-26 MED ORDER — ERYTHROMYCIN 5 MG/GM OP OINT: 1.0000 "application " | TOPICAL_OINTMENT | Freq: Four times a day (QID) | OPHTHALMIC | Status: DC

## 2014-07-26 NOTE — ED Notes (Signed)
Pt states that 2 weeks ago eyes started getting a little red and burn but gotten worse. Pt states that she had the same problem last year and was given Prednisolone acetate 1% but her PCP has retired now and cant get prescription.

## 2014-07-26 NOTE — Discharge Instructions (Signed)
Use eye drops and eye ointment as directed for your eye symptoms. Use cool compresses to the eyes to help soothe them. See the ophthalmologist in 2-3 days for ongoing evaluation of your symptoms. Return to the ER for changes or worsening symptoms.   Conjunctivitis Conjunctivitis is commonly called "pink eye." Conjunctivitis can be caused by bacterial or viral infection, allergies, or injuries. There is usually redness of the lining of the eye, itching, discomfort, and sometimes discharge. There may be deposits of matter along the eyelids. A viral infection usually causes a watery discharge, while a bacterial infection causes a yellowish, thick discharge. Pink eye is very contagious and spreads by direct contact. You may be given antibiotic eyedrops as part of your treatment. Before using your eye medicine, remove all drainage from the eye by washing gently with warm water and cotton balls. Continue to use the medication until you have awakened 2 mornings in a row without discharge from the eye. Do not rub your eye. This increases the irritation and helps spread infection. Use separate towels from other household members. Wash your hands with soap and water before and after touching your eyes. Use cold compresses to reduce pain and sunglasses to relieve irritation from light. Do not wear contact lenses or wear eye makeup until the infection is gone. SEEK MEDICAL CARE IF:   Your symptoms are not better after 3 days of treatment.  You have increased pain or trouble seeing.  The outer eyelids become very red or swollen. Document Released: 07/01/2004 Document Revised: 08/16/2011 Document Reviewed: 05/24/2005 [facility] Patient Information 2015 [place], [place]. This information is not intended to replace advice given to you by your health care provider. Make sure you discuss any questions you have with your health care provider.

## 2014-07-26 NOTE — ED Provider Notes (Signed)
CSN: [id number]     Arrival date & time 07/26/14  1045 History   First MD Initiated Contact with Patient 07/26/14 1052     Chief Complaint  Patient presents with  . red eyes      (Consider location/radiation/quality/duration/timing/severity/associated sxs/prior Treatment) HPI Comments: Alison Collins is a 44 y.o. female with a PMHx of HTN, migraines, and remote gestational DM, who presents to the ED with complaints of eye redness 2 weeks. She states that she had a similar episode of eye redness last year, stating that it usually occurs around this time of year. She states that yesterday she went outside and this exacerbated her symptoms. Endorses a gritty sensation, itching, and watery drainage out of both eyes. She does endorse that her eyelids were somewhat stuck together this morning, but denies any yellowish or purulent drainage. Denies any pain, periorbital erythema or edema, foreign bodies, recent welding, fevers, URI symptoms, contact lens use, vision changes, or headaches. No known sick contacts. She states that in the past she used Prednisolone Acetate 1% drops that finally fixed her symptoms. She has used visine total, clear eyes cooling, tetrahydrazoline, and similasan drops without relief. Has seasonal allergies but doesn't take anything for it.   Patient is a 44 y.o. female presenting with conjunctivitis. The history is provided by the patient. No language interpreter was used.  Conjunctivitis This is a recurrent problem. The current episode started 1 to 4 weeks ago. The problem occurs constantly. The problem has been unchanged. Pertinent negatives include no chills, congestion, coughing, fever, headaches, neck pain, rash, sore throat or visual change. Exacerbated by: outside exposure. Treatments tried: visine total, clear eyes cooling, tetrahydrazoline, similasan drops. The treatment provided no relief.    Past Medical History  Diagnosis Date  . GDM (gestational diabetes mellitus)    . Normal spontaneous vaginal delivery   . Spontaneous abortion     2  . Hypertension   . Migraine    Past Surgical History  Procedure Laterality Date  . Liposuction     Family History  Problem Relation Age of Onset  . Hypertension Mother   . Heart disease Father    History  Substance Use Topics  . Smoking status: Never Smoker   . Smokeless tobacco: Never Used  . Alcohol Use: Yes     Comment: OCCASIONALLY   OB History    Gravida Para Term Preterm AB TAB SAB Ectopic Multiple Living   Review of Systems  Constitutional: Negative for fever and chills.  HENT: Negative for congestion, ear pain, rhinorrhea, sneezing and sore throat.   Eyes: Positive for discharge (watery), redness and itching. Negative for photophobia, pain and visual disturbance.  Respiratory: Negative for cough.   Musculoskeletal: Negative for neck pain.  Skin: Negative for color change and rash.  Neurological: Negative for headaches.   10 Systems reviewed and are negative for acute change except as noted in the HPI.    Allergies  Penicillins  Home Medications   Prior to Admission medications   Medication Sig Start Date End Date Taking? Authorizing Provider  HYDROcodone-acetaminophen (NORCO) 5-325 MG per tablet Take 1 tablet by mouth every 6 (six) hours as needed for moderate pain. 03/12/14   Pilar Jarvis, MD  ibuprofen (ADVIL,MOTRIN) 600 MG tablet Take 1 tablet (600 mg total) by mouth every 6 (six) hours as needed. 03/09/14   Arman Filter, NP  methocarbamol (ROBAXIN) 500 MG tablet  Take 1 tablet (500 mg total) by mouth 2 (two) times daily. 03/09/14   Arman Filter, NP   BP 159/91 mmHg  Pulse 85  Temp(Src) 98.5 F (36.9 C) (Oral)  Resp 17  SpO2 97% Physical Exam  Constitutional: She is oriented to person, place, and time. Vital signs are normal. She appears well-developed and well-nourished.  Non-toxic appearance. No distress.  Afebrile, nontoxic, NAD  HENT:  Head: Normocephalic  and atraumatic.  Nose: Nose normal.  Mouth/Throat: Mucous membranes are normal.  Eyes: EOM are normal. Pupils are equal, round, and reactive to light. Lids are everted and swept, no foreign bodies found. Right eye exhibits no chemosis and no discharge. No foreign body present in the right eye. Left eye exhibits no chemosis and no discharge. No foreign body present in the left eye. Right conjunctiva is injected. Left conjunctiva is injected.  Slit lamp exam:      The right eye shows no corneal abrasion, no corneal flare, no corneal ulcer, no foreign body, no hyphema, no hypopyon, no fluorescein uptake and no anterior chamber bulge.       The left eye shows no corneal abrasion, no corneal flare, no corneal ulcer, no foreign body, no hyphema, no hypopyon, no fluorescein uptake and no anterior chamber bulge.  B/l conjunctiva injected globally EOMI, PERRL No discharge or chemosis, no FBs visualized No fluorescein uptake, no abrasions/flare/ulcerations No hyphema or hypopion No anterior chamber abnormalities Visual acuity 20/70 in each eye (states she needs glasses)  Neck: Normal range of motion. Neck supple.  Cardiovascular: Normal rate.   Pulmonary/Chest: Effort normal. No respiratory distress.  Abdominal: Normal appearance. She exhibits no distension.  Musculoskeletal: Normal range of motion.  Neurological: She is alert and oriented to person, place, and time. She has normal strength. No sensory deficit.  Skin: Skin is warm, dry and intact. No rash noted.  Psychiatric: She has a normal mood and affect. Her behavior is normal.  Nursing note and vitals reviewed.   ED Course  Procedures (including critical care time) Labs Review Labs Reviewed - No data to display  Imaging Review No results found.   EKG Interpretation None      MDM   Final diagnoses:  Bilateral conjunctivitis    44 y.o. female with eye redness, itching, gritty sensation, and watering c/w allergic conjunctivitis  not responding to OTC eye drops for allergic symptoms. Globally injected conjunctiva bilaterally. No periorbital edema or erythema, no blepharitis. No headaches or vision changes. Globe intact and anterior structures WNL on exam. Visual acuity 20/70 in each eye but pt endorses needing glasses. No fluorescein uptake, no abrasions or ulcerations, no FBs visualized.  Not taking antihistamine, therefore advised pt to start taking this. States last time this occurred, prednisolone acetate 1% was what finally fixed it. Given that she has had some drainage, albeit watery, will start abx ointment but agreed to also giving the prednisolone drops. Advised ophthalmology f/up in 2-3 days for recheck. I explained the diagnosis and have given explicit precautions to return to the ER including for any other new or worsening symptoms. The patient understands and accepts the medical plan as it's been dictated and I have answered their questions. Discharge instructions concerning home care and prescriptions have been given. The patient is STABLE and is discharged to home in good condition.  BP 159/91 mmHg  Pulse 85  Temp(Src) 98.5 F (36.9 C) (Oral)  Resp 17  SpO2 97%  Meds ordered this encounter  Medications  .  tetracaine (PONTOCAINE) 0.5 % ophthalmic solution 2 drop    Sig:   . fluorescein ophthalmic strip 1 strip    Sig:   . prednisoLONE acetate (PRED FORTE) 1 % ophthalmic suspension    Sig: Place 2 drops into both eyes 4 (four) times daily.    Dispense:  10 mL    Refill:  0    Order Specific Question:  Supervising Provider    Answer:  Eber HongMILLER, BRIAN D [[id number]]  . erythromycin ophthalmic ointment    Sig: Place 1 application into both eyes 4 (four) times daily. Apply thin 1/2 inch ribbon to lower eyelid every 6 hours x 7 days    Dispense:  3.5 g    Refill:  0    Order Specific Question:  Supervising Provider    Answer:  Vida RollerMILLER, BRIAN D [address][[id number]]        Nuriyah Hanline Strupp Pinesdaleamprubi-Soms, PA-C 07/26/14  1149  Lyanne CoKevin M Campos, MD 07/26/14 1527

## 2015-05-28 ENCOUNTER — Encounter (HOSPITAL_COMMUNITY): Payer: Self-pay | Admitting: Emergency Medicine

## 2015-05-28 ENCOUNTER — Emergency Department (HOSPITAL_COMMUNITY)
Admission: EM | Admit: 2015-05-28 | Discharge: 2015-05-28 | Disposition: A | Discharge status: Home / Self Care | Payer: No Typology Code available for payment source | Attending: Emergency Medicine | Admitting: Emergency Medicine

## 2015-05-28 DIAGNOSIS — Z7952 Long term (current) use of systemic steroids: Secondary | ICD-10-CM | POA: Insufficient documentation

## 2015-05-28 DIAGNOSIS — R569 Unspecified convulsions: Secondary | ICD-10-CM | POA: Insufficient documentation

## 2015-05-28 DIAGNOSIS — I10 Essential (primary) hypertension: Secondary | ICD-10-CM | POA: Diagnosis not present

## 2015-05-28 DIAGNOSIS — Z79899 Other long term (current) drug therapy: Secondary | ICD-10-CM | POA: Insufficient documentation

## 2015-05-28 DIAGNOSIS — Z8632 Personal history of gestational diabetes: Secondary | ICD-10-CM | POA: Diagnosis not present

## 2015-05-28 DIAGNOSIS — Z880 Allergy status to penicillin: Secondary | ICD-10-CM | POA: Insufficient documentation

## 2015-05-28 LAB — BASIC METABOLIC PANEL
Anion gap: 8 (ref 5–15)
BUN: 10 mg/dL (ref 6–20)
CALCIUM: 9.4 mg/dL (ref 8.9–10.3)
CO2: 28 mmol/L (ref 22–32)
CREATININE: 0.8 mg/dL (ref 0.44–1.00)
Chloride: 102 mmol/L (ref 101–111)
GFR calc Af Amer: >60 mL/min (ref >60)
Glucose, Bld: 114 mg/dL — ABNORMAL HIGH (ref 65–99)
POTASSIUM: 4 mmol/L (ref 3.5–5.1)
SODIUM: 138 mmol/L (ref 135–145)

## 2015-05-28 LAB — CBC
HCT: 39.9 % (ref 36.0–46.0)
Hemoglobin: 13.7 g/dL (ref 12.0–15.0)
MCH: 29 pg (ref 26.0–34.0)
MCHC: 34.3 g/dL (ref 30.0–36.0)
MCV: 84.4 fL (ref 78.0–100.0)
PLATELETS: 229 10*3/uL (ref 150–400)
RBC: 4.73 MIL/uL (ref 3.87–5.11)
RDW: 14.4 % (ref 11.5–15.5)
WBC: 11.9 10*3/uL — AB (ref 4.0–10.5)

## 2015-05-28 NOTE — ED Notes (Signed)
MD at bedside. 
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
## 2015-05-28 NOTE — Discharge Instructions (Signed)
Nonepileptic Seizures °Nonepileptic seizures are seizures that are not caused by abnormal electrical signals in your brain. These seizures often seem like epileptic seizures, but they are not caused by epilepsy.  °There are two types of nonepileptic seizures: °· A physiologic nonepileptic seizure results from a disruption in your brain. °· A psychogenic seizure results from emotional stress. These seizures are sometimes called pseudoseizures. °CAUSES  °Causes of physiologic nonepileptic seizures include:  °· Sudden drop in blood pressure. °· Low blood sugar. °· Low levels of salt (sodium) in your blood. °· Low levels of calcium in your blood. °· Migraine. °· Heart rhythm problems. °· Sleep disorders. °· Drug and alcohol abuse. °Common causes of psychogenic nonepileptic seizures include: °· Stress. °· Emotional trauma. °· Sexual or physical abuse. °· Major life events, such as divorce or the death of a loved one. °· Mental health disorders, including panic attack and hyperactivity disorder. °SIGNS AND SYMPTOMS °A nonepileptic seizure can look like an epileptic seizure, including uncontrollable shaking (convulsions), or changes in attention, behavior, or the ability to remain awake and alert. However, there are some differences. Nonepileptic seizures usually: °· Do not cause physical injuries. °· Start slowly. °· Include crying or shrieking. °· Last longer than 2 minutes. °· Have a short recovery time without headache or exhaustion. °DIAGNOSIS  °Your health care provider can usually diagnose nonepileptic seizures after taking your medical history and giving you a physical exam. Your health care provider may want to talk to your friends or relatives who have seen you have a seizure.  °You may also need to have tests to look for causes of physiologic nonepileptic seizures. This may include an electroencephalogram (EEG), which is a test that measures electrical activity in your brain. If you have had an epileptic  seizure, the results of your EEG will be abnormal. If your health care provider thinks you have had a psychogenic nonepileptic seizure, you may need to see a mental health specialist for an evaluation. °TREATMENT  °Treatment depends on the type and cause of your seizures. °· For physiologic nonepileptic seizures, treatment is aimed at addressing the underlying condition that caused the seizures. These seizures usually stop when the underlying condition is properly treated. °· Nonepileptic seizures do not respond to the seizure medicines used to treat epilepsy. °· For psychogenic seizures, you may need to work with a mental health specialist. °HOME CARE INSTRUCTIONS °Home care will depend on the type of nonepileptic seizures you have.  °· Follow all your health care provider's instructions. °· Keep all your follow-up appointments. °SEEK MEDICAL CARE IF: °You continue to have seizures after treatment. °SEEK IMMEDIATE MEDICAL CARE IF: °· Your seizures change or become more frequent. °· You injure yourself during a seizure. °· You have one seizure after another. °· You have trouble recovering from a seizure. °· You have chest pain or trouble breathing. °MAKE SURE YOU: °· Understand these instructions. °· Will watch your condition. °· Will get help right away if you are not doing well or get worse. °  °This information is not intended to replace advice given to you by your health care provider. Make sure you discuss any questions you have with your health care provider. °  °Document Released: 07/09/2005 Document Revised: 06/14/2014 Document Reviewed: 03/20/2013 °Elsevier Interactive Patient Education ©2016 [employer] ° °
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
## 2015-05-28 NOTE — ED Provider Notes (Signed)
CSN: [id number]     Arrival date & time 05/28/15  2107 History   First MD Initiated Contact with Patient 05/28/15 2151     Chief Complaint  Patient presents with  . Seizures    The patient's boyfriends said he witnessed the patient having a seizure that lasted for 10 minutes.  The patient says she does not remember what happened.  The boyfriend said the patient started shaking, then drooling and she bit her tongue.      (Consider location/radiation/quality/duration/timing/severity/associated sxs/prior Treatment) Patient is a 44 y.o. female presenting with seizures. The history is provided by the patient.  Seizures Seizure activity on arrival: yes   Preceding symptoms: aura (feels like being possessed)   Preceding symptoms: no headache and no vision change   Initial focality: started drooling initially, then garbled speech. Episode characteristics: partial responsiveness (conscious during episode unable to respond)   Episode characteristics comment:  Shaking slightly for 2 minutes, not talking for 10 minutes Postictal symptoms: no confusion and no somnolence   Return to baseline: yes   Severity:  Moderate Duration:  10 minutes Timing:  Once Number of seizures this episode:  1 Context: not alcohol withdrawal, not fever and not possible medication ingestion   Recent head injury:  No recent head injuries PTA treatment:  None History of seizures: no (but has had recurrent episodes of unresponsive episodes since age 57)     Past Medical History  Diagnosis Date  . GDM (gestational diabetes mellitus)   . Normal spontaneous vaginal delivery   . Spontaneous abortion     2  . Hypertension   . Migraine    Past Surgical History  Procedure Laterality Date  . Liposuction     Family History  Problem Relation Age of Onset  . Hypertension Mother   . Heart disease Father    Social History  Substance Use Topics  . Smoking status: Never Smoker   . Smokeless tobacco: Never Used  .  Alcohol Use: Yes     Comment: OCCASIONALLY   OB History    Gravida Para Term Preterm AB TAB SAB Ectopic Multiple Living   Review of Systems  Neurological: Positive for seizures.  All other systems reviewed and are negative.     Allergies  Penicillins  Home Medications   Prior to Admission medications   Medication Sig Start Date End Date Taking? Authorizing Provider  erythromycin ophthalmic ointment Place 1 application into both eyes 4 (four) times daily. Apply thin 1/2 inch ribbon to lower eyelid every 6 hours x 7 days 07/26/14   Mercedes Camprubi-Soms, PA-C  HYDROcodone-acetaminophen (NORCO) 5-325 MG per tablet Take 1 tablet by mouth every 6 (six) hours as needed for moderate pain. 03/12/14   Pilar Jarvis, MD  ibuprofen (ADVIL,MOTRIN) 600 MG tablet Take 1 tablet (600 mg total) by mouth every 6 (six) hours as needed. 03/09/14   Earley Favor, NP  loratadine (CLARITIN) 10 MG tablet Take 1 tablet (10 mg total) by mouth daily. 07/26/14   Mercedes Camprubi-Soms, PA-C  methocarbamol (ROBAXIN) 500 MG tablet Take 1 tablet (500 mg total) by mouth 2 (two) times daily. 03/09/14   Earley Favor, NP  prednisoLONE acetate (PRED FORTE) 1 % ophthalmic suspension Place 2 drops into both eyes 4 (four) times daily. 07/26/14   Mercedes Camprubi-Soms, PA-C   BP 176/96 mmHg  Pulse 105  Temp(Src) 98.2 F (36.8 C) (Oral)  Resp 18  Ht 5\' 3"  (1.6 m)  Wt 199 lb 8 oz (90.493 kg)  BMI 35.35 kg/m2  SpO2 96%  LMP 05/24/2015 Physical Exam  Constitutional: She is oriented to person, place, and time. She appears well-developed and well-nourished. No distress.  HENT:  Head: Normocephalic.  Eyes: Conjunctivae are normal.  Neck: Neck supple. No tracheal deviation present.  Cardiovascular: Normal rate, regular rhythm and normal heart sounds.   Pulmonary/Chest: Effort normal and breath sounds normal. No respiratory distress.  Abdominal: Soft. She exhibits no distension.  Neurological: She is alert  and oriented to person, place, and time. She has normal strength. No cranial nerve deficit or sensory deficit. GCS eye subscore is 4. GCS verbal subscore is 5. GCS motor subscore is 6.  Normal finger to nose testing and rapid alternating movement   Skin: Skin is warm and dry.  Psychiatric: She has a normal mood and affect.    ED Course  Procedures (including critical care time) Labs Review Labs Reviewed  CBC - Abnormal; Notable for the following:    WBC 11.9 (*)    All other components within normal limits  BASIC METABOLIC PANEL  POC URINE PREG, ED  CBG MONITORING, ED    Imaging Review No results found. I have personally reviewed and evaluated these images and lab results as part of my medical decision-making.   EKG Interpretation None      MDM   Final diagnoses:  Unspecified convulsions (HCC)    44 y.o. female presents with a convulsive episode where she has increase of anxiety and "I really believe in God and I feel like someone is possessing me", drools, had shaking episode for 2 minutes after speaking in tongues to her significant other, and would not speak before spontaneously resolving. She is fully conscious throughout these episodes but has memory loss about them. Have been happening intermittently since age 44 but has never asked a medical professional about them. Symptoms seem to be non-neurologic given description. Pt completely normal now. No indication for emergent imaging. No significant metabolic abnormality to explain symptoms. Patient needs to establish primary care in the area and was provided contact information to do so. Referred to neurology on an OP basis for further non-emergent workup.     Lyndal Pulleyaniel Paula Zietz, MD 05/29/15 [phone]

## 2015-06-13 ENCOUNTER — Encounter (HOSPITAL_COMMUNITY): Payer: Self-pay | Admitting: *Deleted

## 2015-06-13 ENCOUNTER — Emergency Department (HOSPITAL_COMMUNITY): Payer: No Typology Code available for payment source

## 2015-06-13 ENCOUNTER — Emergency Department (HOSPITAL_COMMUNITY)
Admission: EM | Admit: 2015-06-13 | Discharge: 2015-06-13 | Disposition: A | Discharge status: Home / Self Care | Payer: No Typology Code available for payment source | Attending: Physician Assistant | Admitting: Physician Assistant

## 2015-06-13 DIAGNOSIS — R109 Unspecified abdominal pain: Secondary | ICD-10-CM

## 2015-06-13 DIAGNOSIS — S92102A Unspecified fracture of left talus, initial encounter for closed fracture: Secondary | ICD-10-CM

## 2015-06-13 DIAGNOSIS — R569 Unspecified convulsions: Secondary | ICD-10-CM | POA: Insufficient documentation

## 2015-06-13 DIAGNOSIS — Y9389 Activity, other specified: Secondary | ICD-10-CM | POA: Insufficient documentation

## 2015-06-13 DIAGNOSIS — Z880 Allergy status to penicillin: Secondary | ICD-10-CM | POA: Insufficient documentation

## 2015-06-13 DIAGNOSIS — S92152A Displaced avulsion fracture (chip fracture) of left talus, initial encounter for closed fracture: Secondary | ICD-10-CM | POA: Insufficient documentation

## 2015-06-13 DIAGNOSIS — Y92410 Unspecified street and highway as the place of occurrence of the external cause: Secondary | ICD-10-CM | POA: Insufficient documentation

## 2015-06-13 DIAGNOSIS — S9002XA Contusion of left ankle, initial encounter: Secondary | ICD-10-CM | POA: Insufficient documentation

## 2015-06-13 DIAGNOSIS — S20212A Contusion of left front wall of thorax, initial encounter: Secondary | ICD-10-CM | POA: Insufficient documentation

## 2015-06-13 DIAGNOSIS — Y998 Other external cause status: Secondary | ICD-10-CM | POA: Insufficient documentation

## 2015-06-13 DIAGNOSIS — S1083XA Contusion of other specified part of neck, initial encounter: Secondary | ICD-10-CM | POA: Insufficient documentation

## 2015-06-13 DIAGNOSIS — S9001XA Contusion of right ankle, initial encounter: Secondary | ICD-10-CM | POA: Insufficient documentation

## 2015-06-13 DIAGNOSIS — S301XXA Contusion of abdominal wall, initial encounter: Secondary | ICD-10-CM | POA: Insufficient documentation

## 2015-06-13 DIAGNOSIS — Z79899 Other long term (current) drug therapy: Secondary | ICD-10-CM | POA: Insufficient documentation

## 2015-06-13 DIAGNOSIS — Z23 Encounter for immunization: Secondary | ICD-10-CM | POA: Insufficient documentation

## 2015-06-13 DIAGNOSIS — I10 Essential (primary) hypertension: Secondary | ICD-10-CM | POA: Insufficient documentation

## 2015-06-13 DIAGNOSIS — Z8632 Personal history of gestational diabetes: Secondary | ICD-10-CM | POA: Insufficient documentation

## 2015-06-13 HISTORY — DX: Unspecified convulsions: R56.9

## 2015-06-13 LAB — COMPREHENSIVE METABOLIC PANEL
ALT: 20 U/L (ref 14–54)
ANION GAP: 7 (ref 5–15)
AST: 27 U/L (ref 15–41)
Albumin: 4.1 g/dL (ref 3.5–5.0)
Alkaline Phosphatase: 57 U/L (ref 38–126)
BUN: 15 mg/dL (ref 6–20)
CHLORIDE: 105 mmol/L (ref 101–111)
CO2: 28 mmol/L (ref 22–32)
Calcium: 9.6 mg/dL (ref 8.9–10.3)
Creatinine, Ser: 0.87 mg/dL (ref 0.44–1.00)
Glucose, Bld: 91 mg/dL (ref 65–99)
POTASSIUM: 3.2 mmol/L — AB (ref 3.5–5.1)
SODIUM: 140 mmol/L (ref 135–145)
Total Bilirubin: 0.7 mg/dL (ref 0.3–1.2)
Total Protein: 7.3 g/dL (ref 6.5–8.1)

## 2015-06-13 LAB — I-STAT BETA HCG BLOOD, ED (MC, WL, AP ONLY)

## 2015-06-13 LAB — CBC WITH DIFFERENTIAL/PLATELET
Basophils Absolute: 0 10*3/uL (ref 0.0–0.1)
Basophils Relative: 0 %
EOS ABS: 0 10*3/uL (ref 0.0–0.7)
Eosinophils Relative: 0 %
HEMATOCRIT: 40.5 % (ref 36.0–46.0)
HEMOGLOBIN: 13.3 g/dL (ref 12.0–15.0)
LYMPHS ABS: 2 10*3/uL (ref 0.7–4.0)
Lymphocytes Relative: 16 %
MCH: 28.2 pg (ref 26.0–34.0)
MCHC: 32.8 g/dL (ref 30.0–36.0)
MCV: 86 fL (ref 78.0–100.0)
MONOS PCT: 3 %
Monocytes Absolute: 0.4 10*3/uL (ref 0.1–1.0)
NEUTROS ABS: 10.4 10*3/uL — AB (ref 1.7–7.7)
NEUTROS PCT: 81 %
Platelets: 226 10*3/uL (ref 150–400)
RBC: 4.71 MIL/uL (ref 3.87–5.11)
RDW: 15.2 % (ref 11.5–15.5)
WBC: 12.9 10*3/uL — AB (ref 4.0–10.5)

## 2015-06-13 LAB — I-STAT TROPONIN, ED: TROPONIN I, POC: 0 ng/mL (ref 0.00–0.08)

## 2015-06-13 LAB — I-STAT CG4 LACTIC ACID, ED
Lactic Acid, Venous: 1.63 mmol/L (ref 0.5–2.0)
Lactic Acid, Venous: 1.19 mmol/L (ref 0.5–2.0)

## 2015-06-13 MED ORDER — FENTANYL CITRATE (PF) 100 MCG/2ML IJ SOLN
25.0000 ug | Freq: Once | INTRAMUSCULAR | Status: AC
  Administered 2015-06-13: 25 ug via INTRAVENOUS
  Filled 2015-06-13: qty 2

## 2015-06-13 MED ORDER — TETANUS-DIPHTH-ACELL PERTUSSIS 5-2.5-18.5 LF-MCG/0.5 IM SUSP
0.5000 mL | Freq: Once | INTRAMUSCULAR | Status: AC
  Administered 2015-06-13: 0.5 mL via INTRAMUSCULAR
  Filled 2015-06-13: qty 0.5

## 2015-06-13 MED ORDER — LIDOCAINE HCL (PF) 1 % IJ SOLN
30.0000 mL | Freq: Once | INTRAMUSCULAR | Status: AC
  Administered 2015-06-13: 30 mL via INTRADERMAL
  Filled 2015-06-13: qty 30

## 2015-06-13 MED ORDER — OXYCODONE-ACETAMINOPHEN 5-325 MG PO TABS
1.0000 | ORAL_TABLET | Freq: Once | ORAL | Status: AC
  Administered 2015-06-13: 1 via ORAL
  Filled 2015-06-13: qty 1

## 2015-06-13 MED ORDER — OXYCODONE-ACETAMINOPHEN 5-325 MG PO TABS: 1.0000 | ORAL_TABLET | Freq: Four times a day (QID) | ORAL | Status: DC | PRN

## 2015-06-13 MED ORDER — IOHEXOL 350 MG/ML SOLN
130.0000 mL | Freq: Once | INTRAVENOUS | Status: AC | PRN
Start: 2015-06-13 — End: 2015-06-13
  Administered 2015-06-13: 130 mL via INTRAVENOUS

## 2015-06-13 MED ORDER — FENTANYL CITRATE (PF) 100 MCG/2ML IJ SOLN
25.0000 ug | Freq: Once | INTRAMUSCULAR | Status: DC
  Filled 2015-06-13: qty 2
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
## 2015-06-13 NOTE — ED Notes (Signed)
Transported patient to CT[facility] for scans; visitor waiting in room

## 2015-06-13 NOTE — ED Notes (Signed)
Patient has returned from being out of the department; placed patient back on monitor, continuous pulse oximetry and blood pressure cuff; visitor at bedside 
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
## 2015-06-13 NOTE — ED Notes (Signed)
MD suturing knee laceration

## 2015-06-13 NOTE — ED Notes (Signed)
Pt given ice pack

## 2015-06-13 NOTE — ED Notes (Signed)
Pt states she feels dizzy when walking, pt refusing to walk any more, states "just give me a wheelchair, I want to go home"

## 2015-06-13 NOTE — ED Notes (Signed)
Patient transported to X-ray 
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
## 2017-05-11 ENCOUNTER — Encounter (HOSPITAL_COMMUNITY): Payer: Self-pay | Admitting: Emergency Medicine

## 2017-05-11 ENCOUNTER — Emergency Department (HOSPITAL_COMMUNITY)
Admission: EM | Admit: 2017-05-11 | Discharge: 2017-05-12 | Disposition: A | Discharge status: Home / Self Care | Payer: BLUE CROSS/BLUE SHIELD | Attending: Emergency Medicine | Admitting: Emergency Medicine

## 2017-05-11 ENCOUNTER — Other Ambulatory Visit: Payer: Self-pay

## 2017-05-11 DIAGNOSIS — Z79899 Other long term (current) drug therapy: Secondary | ICD-10-CM | POA: Diagnosis not present

## 2017-05-11 DIAGNOSIS — I10 Essential (primary) hypertension: Secondary | ICD-10-CM | POA: Diagnosis not present

## 2017-05-11 DIAGNOSIS — R042 Hemoptysis: Secondary | ICD-10-CM | POA: Diagnosis present

## 2017-05-11 DIAGNOSIS — J209 Acute bronchitis, unspecified: Secondary | ICD-10-CM | POA: Diagnosis not present

## 2017-05-11 HISTORY — DX: Anemia, unspecified: D64.9

## 2017-05-11 MED ORDER — IPRATROPIUM-ALBUTEROL 0.5-2.5 (3) MG/3ML IN SOLN
3.0000 mL | Freq: Once | RESPIRATORY_TRACT | Status: AC
  Administered 2017-05-12: 3 mL via RESPIRATORY_TRACT
  Filled 2017-05-11: qty 3

## 2017-05-11 MED ORDER — BENZONATATE 100 MG PO CAPS
100.0000 mg | ORAL_CAPSULE | Freq: Once | ORAL | Status: AC
  Administered 2017-05-12: 100 mg via ORAL
  Filled 2017-05-11: qty 1

## 2017-05-11 MED ORDER — ACETAMINOPHEN 325 MG PO TABS
650.0000 mg | ORAL_TABLET | Freq: Once | ORAL | Status: AC
  Administered 2017-05-11: 650 mg via ORAL
  Filled 2017-05-11: qty 2

## 2017-05-11 MED ORDER — PREDNISONE 20 MG PO TABS
40.0000 mg | ORAL_TABLET | Freq: Once | ORAL | Status: AC
  Administered 2017-05-12: 40 mg via ORAL
  Filled 2017-05-11: qty 2

## 2017-05-11 NOTE — ED Triage Notes (Signed)
Pt states she has had a cough and congestion since last Monday  Pt states she has been using theraflu and nyquil without relief  Pt states today she has been coughing up blood

## 2017-05-11 NOTE — ED Provider Notes (Signed)
[facility]-EMERGENCY DEPT Provider Note   CSN: [id number] Arrival date & time: 05/11/17  2146     History   Chief Complaint Chief Complaint  Patient presents with  . Hemoptysis    HPI Alison Collins is a 46 y.o. female.  HPI 46 year old female past medical history significant for migraines and hypertension presents to the emergency department today with a week of cough, nasal congestion, chest congestion.  Patient states that her symptoms started approximately 1 week ago.  States that she is continued to have a productive cough.  States that she continues to be congested despite TheraFlu and NyQuil.  Patient denies any associated fevers or chills.  The patient states that today she noticed a small specks of blood in her mucus when she coughed.  She has noted some specks of blood when she blows her nose intermittently.  Patient denies any gross hemoptysis.  The patient denies any associated chest pain or shortness of breath.  She denies any history of PE/DVT, prolonged immobilization, recent hospitalization/surgeries, unilateral leg swelling, OCP use, tobacco use.  Patient has tried several over-the-counter medications without any relief.  The patient states that the cough and nasal congestion is really bothering her.  Pt denies any fever, chill, ha, vision changes, lightheadedness, dizziness, congestion, neck pain, cp, sob, abd pain, n/v/d, urinary symptoms, change in bowel habits, melena, hematochezia, lower extremity paresthesias.  Past Medical History:  Diagnosis Date  . Anemia   . GDM (gestational diabetes mellitus)   . Hypertension   . Migraine   . Normal spontaneous vaginal delivery   . Seizures (HCC)   . Spontaneous abortion    2    Patient Active Problem List   Diagnosis Date Noted  . SUI (stress urinary incontinence, female) 01/25/2011  . Hypertension 01/25/2011  . Depression 01/25/2011  . Anxiety 01/25/2011    Past Surgical History:    Procedure Laterality Date  . BREAST SURGERY    . LIPOSUCTION      OB History    Gravida Para Term Preterm AB Living   3 1     2 1    SAB TAB Ectopic Multiple Live Births   2               Home Medications    Prior to Admission medications   Medication Sig Start Date End Date Taking? Authorizing Provider  DM-Phenylephrine-Acetaminophen (THERAFLU EXPRESSMAX SEV CLD/CG) 10-5-325 MG TABS Take 1 tablet by mouth every 8 (eight) hours as needed (cold, cough).   Yes [provider]  Phenyleph-Doxylamine-DM-APAP (NYQUIL SEVERE COLD/FLU) 5-6.25-10-325 MG/15ML LIQD Take 30 mLs by mouth at bedtime as needed (cough,sleep).   Yes [provider]    Family History Family History  Problem Relation Age of Onset  . Hypertension Mother   . Heart disease Father     Social History Social History   Tobacco Use  . Smoking status: Never Smoker  . Smokeless tobacco: Never Used  Substance Use Topics  . Alcohol use: No    Frequency: Never    Comment: former  . Drug use: No     Allergies   Penicillins   Review of Systems Review of Systems  Constitutional: Negative for chills and fever.  HENT: Positive for congestion, rhinorrhea and sinus pressure. Negative for sore throat.   Eyes: Negative for visual disturbance.  Respiratory: Positive for cough. Negative for chest tightness, shortness of breath and wheezing.   Cardiovascular: Negative for chest pain.  Gastrointestinal: Negative for  abdominal pain, diarrhea, nausea and vomiting.  Genitourinary: Negative for dysuria, flank pain, frequency, hematuria, urgency, vaginal bleeding and vaginal discharge.  Musculoskeletal: Negative for arthralgias and myalgias.  Skin: Negative for rash.  Neurological: Negative for dizziness, syncope, weakness, light-headedness, numbness and headaches.  Psychiatric/Behavioral: Negative for sleep disturbance. The patient is not nervous/anxious.      Physical Exam Updated Vital Signs BP  (!) 137/125 (BP Location: Left Arm)   Pulse 95   Temp 99.8 F (37.7 C) (Oral)   Resp 20   LMP 05/01/2017 (Approximate)   SpO2 96%   Physical Exam  Constitutional: She is oriented to person, place, and time. She appears well-developed and well-nourished.  Non-toxic appearance. No distress.  HENT:  Head: Normocephalic and atraumatic.  Right Ear: Tympanic membrane, external ear and ear canal normal.  Left Ear: Tympanic membrane, external ear and ear canal normal.  Nose: Mucosal edema and rhinorrhea present. Right sinus exhibits maxillary sinus tenderness. Left sinus exhibits maxillary sinus tenderness.  Mouth/Throat: Uvula is midline and mucous membranes are normal. No trismus in the jaw. No uvula swelling. Posterior oropharyngeal erythema present. No oropharyngeal exudate, posterior oropharyngeal edema or tonsillar abscesses. Tonsils are 1+ on the right. Tonsils are 1+ on the left. No tonsillar exudate.  Eyes: Conjunctivae are normal. Pupils are equal, round, and reactive to light. Right eye exhibits no discharge. Left eye exhibits no discharge.  Neck: Normal range of motion. Neck supple.  Cardiovascular: Normal rate, regular rhythm, normal heart sounds and intact distal pulses. Exam reveals no gallop and no friction rub.  No murmur heard. Pulmonary/Chest: Effort normal and breath sounds normal. No stridor. No respiratory distress. She has no wheezes. She has no rales. She exhibits no tenderness.  Abdominal: Soft. Bowel sounds are normal. There is no tenderness. There is no rebound and no guarding.  Musculoskeletal: Normal range of motion. She exhibits no tenderness.  No lower extremity edema or calf tenderness  Lymphadenopathy:    She has no cervical adenopathy.  Neurological: She is alert and oriented to person, place, and time.  Skin: Skin is warm and dry. Capillary refill takes less than 2 seconds.  Psychiatric: Her behavior is normal. Judgment and thought content normal.  Nursing note  and vitals reviewed.    ED Treatments / Results  Labs (all labs ordered are listed, but only abnormal results are displayed) Labs Reviewed  BASIC METABOLIC PANEL - Abnormal; Notable for the following components:      Result Value   Glucose, Bld 119 (*)    BUN 24 (*)    All other components within normal limits  CBC WITH DIFFERENTIAL/PLATELET    EKG  EKG Interpretation None       Radiology No results found.  Procedures Procedures (including critical care time)  Medications Ordered in ED Medications  benzonatate (TESSALON) capsule 100 mg (not administered)  predniSONE (DELTASONE) tablet 40 mg (not administered)  ipratropium-albuterol (DUONEB) 0.5-2.5 (3) MG/3ML nebulizer solution 3 mL (not administered)  acetaminophen (TYLENOL) tablet 650 mg (650 mg Oral Given 05/11/17 2326)     Initial Impression / Assessment and Plan / ED Course  I have reviewed the triage vital signs and the nursing notes.  Pertinent labs & imaging results that were available during my care of the patient were reviewed by me and considered in my medical decision making (see chart for details).     Patient presents to the ED with complaints of productive cough, nasal congestion, chest congestion and specks of blood in  her mucus when she coughs today.  Cough with ongoing for 1 week.  Denies any known sick contacts.  Patient has tried several over-the-counter cold and flu medication with only little relief.  Patient has no PE risk factors.  Denies any associated chest pain or shortness of breath.  Denies any gross hemoptysis.  On exam patient is overall well-appearing and nontoxic.  Vital signs are reassuring.  Patient is not hypoxic, no tachypnea, no tachycardia noted.  Patient is afebrile.  Patient is hypertensive with history of same.  On exam lungs clear to auscultation bilaterally.  Heart regular rate and rhythm.  No lower extremity edema or calf tenderness.  Lab work is reassuring.  No  leukocytosis.  Electrolytes are reassuring and at patient's baseline.  Chest x-ray was performed that showed no acute abnormalities.  Patient given albuterol inhaler, steroids and cough medicine.  The patient was reassessed and feels significantly improved.  States that her congestion has improved and her cough is also improved.  Patient continues to deny any chest pain or shortness of breath.  Vital signs again remained reassuring.  Patient symptoms likely consistent with a viral bronchitis.  Presentation and history does not seem consistent with PE, dissection, ACS.  The patient does report some blood specks in her sputum.  Denies any gross hemoptysis.  Patient is not hypoxic, no tachypnea noted.  Patient has no PE risk factors.  Feel that patient's symptoms likely due to persistent coughing.  I did discuss with patient that we can do further workup for PE including d-dimer or CTA of chest.  The patient states that she would like to hold off at this time.  Shared decision making was agreed upon with patient.  The patient states that she feels much improved and ready for discharge at this time.    The patient's blood pressure slightly elevated with a history of same.  Patient denies any associated complaints of chest pain, shortness breath, headache, vision changes.  Do not feel any further emergency management is indicated at this time.  Encouraged her to take her blood pressure medicine when she gets home.  Will discharge home with inhaler, Tessalon and prednisone.  Encouraged follow-up with PCP and return to the ED with worsening symptoms.  Pt is hemodynamically stable, in NAD, & able to ambulate in the ED. Evaluation does not show pathology that would require ongoing emergent intervention or inpatient treatment. I explained the diagnosis to the patient. Pain has been managed & has no complaints prior to dc. Pt is comfortable with above plan and is stable for discharge at this time. All questions were  answered prior to disposition. Strict return precautions for f/u to the ED were discussed. Encouraged follow up with PCP.   Final Clinical Impressions(s) / ED Diagnoses   Final diagnoses:  Acute bronchitis, unspecified organism    ED Discharge Orders        Ordered    predniSONE (DELTASONE) 20 MG tablet  Daily with breakfast     05/12/17 0123    benzonatate (TESSALON) 100 MG capsule  Every 8 hours     05/12/17 0123    albuterol (PROVENTIL HFA;VENTOLIN HFA) 108 (90 Base) MCG/ACT inhaler  Every 6 hours PRN     05/12/17 0123       Rise MuLeaphart, Redina Zeller T, PA-C 05/12/17 0134    Geoffery Lyonselo, Douglas, MD 05/12/17 [phone]

## 2017-05-12 ENCOUNTER — Emergency Department (HOSPITAL_COMMUNITY): Payer: BLUE CROSS/BLUE SHIELD

## 2017-05-12 LAB — BASIC METABOLIC PANEL
ANION GAP: 6 (ref 5–15)
BUN: 24 mg/dL — ABNORMAL HIGH (ref 6–20)
CO2: 28 mmol/L (ref 22–32)
Calcium: 9.6 mg/dL (ref 8.9–10.3)
Chloride: 105 mmol/L (ref 101–111)
Creatinine, Ser: 1 mg/dL (ref 0.44–1.00)
GFR calc Af Amer: >60 mL/min (ref >60)
Glucose, Bld: 119 mg/dL — ABNORMAL HIGH (ref 65–99)
POTASSIUM: 4.5 mmol/L (ref 3.5–5.1)
SODIUM: 139 mmol/L (ref 135–145)

## 2017-05-12 LAB — CBC WITH DIFFERENTIAL/PLATELET
BASOS ABS: 0 10*3/uL (ref 0.0–0.1)
Basophils Relative: 0 %
Eosinophils Absolute: 0.1 10*3/uL (ref 0.0–0.7)
Eosinophils Relative: 1 %
HEMATOCRIT: 41.4 % (ref 36.0–46.0)
HEMOGLOBIN: 13.9 g/dL (ref 12.0–15.0)
LYMPHS PCT: 36 %
Lymphs Abs: 3.4 10*3/uL (ref 0.7–4.0)
MCH: 28.5 pg (ref 26.0–34.0)
MCHC: 33.6 g/dL (ref 30.0–36.0)
MCV: 84.8 fL (ref 78.0–100.0)
Monocytes Absolute: 0.5 10*3/uL (ref 0.1–1.0)
Monocytes Relative: 5 %
NEUTROS ABS: 5.3 10*3/uL (ref 1.7–7.7)
NEUTROS PCT: 58 %
PLATELETS: 294 10*3/uL (ref 150–400)
RBC: 4.88 MIL/uL (ref 3.87–5.11)
RDW: 14.3 % (ref 11.5–15.5)
WBC: 9.3 10*3/uL (ref 4.0–10.5)

## 2017-05-12 MED ORDER — PREDNISONE 20 MG PO TABS: 40.0000 mg | ORAL_TABLET | Freq: Every day | ORAL | 0 refills | Status: DC

## 2017-05-12 MED ORDER — BENZONATATE 100 MG PO CAPS: 100.0000 mg | ORAL_CAPSULE | Freq: Three times a day (TID) | ORAL | 0 refills | Status: DC

## 2017-05-12 MED ORDER — ALBUTEROL SULFATE HFA 108 (90 BASE) MCG/ACT IN AERS: 1.0000 | INHALATION_SPRAY | Freq: Four times a day (QID) | RESPIRATORY_TRACT | 0 refills | Status: DC | PRN

## 2017-05-12 NOTE — Discharge Instructions (Signed)
Your lab work and imaging has been very reassuring.  This is likely a viral bronchitis.  Use the inhaler as needed for wheezing and cough.  Take the Tessalon for cough.  Also take the prednisone starting tomorrow for the next 3 days.  May use over-the-counter decongestants such as Mucinex.  Drink plenty of fluids.  If you develop any chest pain, shortness of breath, significant amount of coughing up blood, fevers return the ED for evaluation.  Follow-up with your primary care doctor the next 2-3 days.

## 2017-06-02 ENCOUNTER — Emergency Department (HOSPITAL_COMMUNITY)
Admission: EM | Admit: 2017-06-02 | Discharge: 2017-06-02 | Disposition: A | Discharge status: Home / Self Care | Payer: BLUE CROSS/BLUE SHIELD | Attending: Emergency Medicine | Admitting: Emergency Medicine

## 2017-06-02 ENCOUNTER — Emergency Department (HOSPITAL_COMMUNITY): Payer: BLUE CROSS/BLUE SHIELD

## 2017-06-02 ENCOUNTER — Other Ambulatory Visit: Payer: Self-pay

## 2017-06-02 ENCOUNTER — Encounter (HOSPITAL_COMMUNITY): Payer: Self-pay

## 2017-06-02 DIAGNOSIS — Z79899 Other long term (current) drug therapy: Secondary | ICD-10-CM | POA: Insufficient documentation

## 2017-06-02 DIAGNOSIS — T300 Burn of unspecified body region, unspecified degree: Secondary | ICD-10-CM

## 2017-06-02 DIAGNOSIS — Y999 Unspecified external cause status: Secondary | ICD-10-CM | POA: Diagnosis not present

## 2017-06-02 DIAGNOSIS — Y92000 Kitchen of unspecified non-institutional (private) residence as  the place of occurrence of the external cause: Secondary | ICD-10-CM | POA: Diagnosis not present

## 2017-06-02 DIAGNOSIS — Z880 Allergy status to penicillin: Secondary | ICD-10-CM | POA: Insufficient documentation

## 2017-06-02 DIAGNOSIS — T23221A Burn of second degree of single right finger (nail) except thumb, initial encounter: Secondary | ICD-10-CM | POA: Insufficient documentation

## 2017-06-02 DIAGNOSIS — T310 Burns involving less than 10% of body surface: Secondary | ICD-10-CM | POA: Insufficient documentation

## 2017-06-02 DIAGNOSIS — T23021A Burn of unspecified degree of single right finger (nail) except thumb, initial encounter: Secondary | ICD-10-CM | POA: Diagnosis present

## 2017-06-02 DIAGNOSIS — I10 Essential (primary) hypertension: Secondary | ICD-10-CM | POA: Diagnosis not present

## 2017-06-02 DIAGNOSIS — Y9389 Activity, other specified: Secondary | ICD-10-CM | POA: Diagnosis not present

## 2017-06-02 DIAGNOSIS — X150XXA Contact with hot stove (kitchen), initial encounter: Secondary | ICD-10-CM | POA: Insufficient documentation

## 2017-06-02 MED ORDER — BACITRACIN ZINC 500 UNIT/GM EX OINT
TOPICAL_OINTMENT | Freq: Two times a day (BID) | CUTANEOUS | Status: DC
  Administered 2017-06-02: 1 via TOPICAL
  Filled 2017-06-02: qty 0.9

## 2017-06-02 MED ORDER — DOXYCYCLINE HYCLATE 100 MG PO CAPS: 100.0000 mg | ORAL_CAPSULE | Freq: Two times a day (BID) | ORAL | 0 refills | Status: DC

## 2017-06-02 NOTE — ED Provider Notes (Signed)
[facility]-EMERGENCY DEPT Provider Note   CSN: [id number] Arrival date & time: 06/02/17  1227     History   Chief Complaint Chief Complaint  Patient presents with  . Burn    HPI Alison ChafeSylvia Collins is a 46 y.o. female.  HPI 46 year old female with no pertinent past medical history presents to the emergency department today for evaluation of a burn to her right middle finger.  The patient states that approximately 1 week ago she had a seizure in her kitchen.  States that she burned her right middle finger on the stove during the seizure.  Patient states that she is been using moisturizers to help with the pain.  States that it has progressively worsened and started to smell today which is why she came to the ED for evaluation.  Patient reports pain with palpation over the blister.  Denies any pain with range of motion of the finger.  Denies any associated erythema, fevers, chills, nausea, emesis.  She is not take anything for the pain prior to arrival.  Nothing makes better or worse. Past Medical History:  Diagnosis Date  . Anemia   . GDM (gestational diabetes mellitus)   . Hypertension   . Migraine   . Normal spontaneous vaginal delivery   . Seizures (HCC)   . Spontaneous abortion    2    Patient Active Problem List   Diagnosis Date Noted  . SUI (stress urinary incontinence, female) 01/25/2011  . Hypertension 01/25/2011  . Depression 01/25/2011  . Anxiety 01/25/2011    Past Surgical History:  Procedure Laterality Date  . BREAST SURGERY    . LIPOSUCTION      OB History    Gravida Para Term Preterm AB Living   3 1     2 1    SAB TAB Ectopic Multiple Live Births   2               Home Medications    Prior to Admission medications   Medication Sig Start Date End Date Taking? Authorizing Provider  albuterol (PROVENTIL HFA;VENTOLIN HFA) 108 (90 Base) MCG/ACT inhaler Inhale 1-2 puffs into the lungs every 6 (six) hours as needed for wheezing or  shortness of breath. 05/12/17  Yes Leaphart, Lynann BeaverKenneth T, PA-C  benzonatate (TESSALON) 100 MG capsule Take 1 capsule (100 mg total) by mouth every 8 (eight) hours. 05/12/17  Yes Leaphart, Lynann BeaverKenneth T, PA-C  Multiple Vitamins-Minerals (WOMENS MULTI VITAMIN & MINERAL PO) Take 1 tablet by mouth daily.   Yes [provider]  predniSONE (DELTASONE) 20 MG tablet Take 2 tablets (40 mg total) by mouth daily with breakfast. 05/12/17  Yes Leaphart, Lynann BeaverKenneth T, PA-C  doxycycline (VIBRAMYCIN) 100 MG capsule Take 1 capsule (100 mg total) by mouth 2 (two) times daily. 06/02/17   Rise MuLeaphart, Kenneth T, PA-C    Family History Family History  Problem Relation Age of Onset  . Hypertension Mother   . Heart disease Father     Social History Social History   Tobacco Use  . Smoking status: Never Smoker  . Smokeless tobacco: Never Used  Substance Use Topics  . Alcohol use: No    Frequency: Never    Comment: former  . Drug use: No     Allergies   Penicillins   Review of Systems Review of Systems  Constitutional: Negative for chills and fever.  Gastrointestinal: Negative for vomiting.  Musculoskeletal: Positive for myalgias.  Skin: Positive for wound.  Neurological: Negative for weakness and  numbness.     Physical Exam Updated Vital Signs BP (!) 142/78 (BP Location: Right Arm)   Pulse 76   Temp 98.3 F (36.8 C) (Oral)   Resp 19   Ht 5\' 4"[id number]$  (1.626 m)   Wt 79.4 kg (175 lb)   LMP 05/16/2017 (Approximate)   SpO2 99%   BMI 30.04 kg/m   Physical Exam  Constitutional: She appears well-developed and well-nourished. No distress.  HENT:  Head: Normocephalic and atraumatic.  Eyes: Right eye exhibits no discharge. Left eye exhibits no discharge. No scleral icterus.  Neck: Normal range of motion.  Cardiovascular: Intact distal pulses.  Pulmonary/Chest: No respiratory distress.  Musculoskeletal: Normal range of motion.  Full range of motion of all joints without any erythema or warmth of the  joints of the right middle finger.  Brisk cap refill.  Sensation intact.  Cap refill normal.  Tender to palpation.  Neurological: She is alert.  Skin: Skin is warm and dry. Capillary refill takes less than 2 seconds. No pallor.  Patient has a blister to the right middle finger over the pad of the finger.  There is no surrounding erythema.  I do not appreciate any purulent drainage.  No area of fluctuance concerning for an abscess.   Psychiatric: Her behavior is normal. Judgment and thought content normal.  Nursing note and vitals reviewed.      ED Treatments / Results  Labs (all labs ordered are listed, but only abnormal results are displayed) Labs Reviewed - No data to display  EKG  EKG Interpretation None       Radiology Dg Hand Complete Right  Result Date: 06/02/2017 CLINICAL DATA:  Burn wound to middle finger EXAM: RIGHT HAND - COMPLETE 3+ VIEW COMPARISON:  None. FINDINGS: No fracture or malalignment. No periostitis or bone destruction. No soft tissue gas or radiopaque foreign body. Mild degenerative changes at the DIP joints. IMPRESSION: No acute osseous abnormality Electronically Signed   By: Jasmine PangKim  Fujinaga M.D.   On: 06/02/2017 17:37    Procedures Procedures (including critical care time)  Medications Ordered in ED Medications - No data to display   Initial Impression / Assessment and Plan / ED Course  I have reviewed the triage vital signs and the nursing notes.  Pertinent labs & imaging results that were available during my care of the patient were reviewed by me and considered in my medical decision making (see chart for details).     Patient presents to the ED with complaints of burn to her right middle finger that she is sustained 7 days ago.  Patient is overall well-appearing and nontoxic.  Vital signs are reassuring.  Patient is afebrile.  On exam patient does have blister to the right middle finger over the pad of the finger.  There is no surrounding  erythema.  No purulent drainage noted.  No area of fluctuance concerning for an abscess.  Full range of motion of all joints without any erythema or warmth concerning for septic arthritis.  X-ray was performed that showed no bony abnormalities.  Stab incision was performed with a D gauge needle without any expression of fluid.  Bacitracin was applied.  Will start patient on antibiotics.  Have given patient follow[facility].  Encouraged follow-up in 2-3 days for wound recheck or sooner if symptoms worsen.  Pt is hemodynamically stable, in NAD, & able to ambulate in the ED. Evaluation does not show pathology that would require ongoing emergent intervention  or inpatient treatment. I explained the diagnosis to the patient. Pain has been managed & has no complaints prior to dc. Pt is comfortable with above plan and is stable for discharge at this time. All questions were answered prior to disposition. Strict return precautions for f/u to the ED were discussed. Encouraged follow up with PCP.  Pt dicussed with Dr. Rush Landmark who is agreeable with the above plan.   Final Clinical Impressions(s) / ED Diagnoses   Final diagnoses:  Burn    ED Discharge Orders        Ordered    doxycycline (VIBRAMYCIN) 100 MG capsule  2 times daily     06/02/17 1758       Wallace Keller 06/02/17 2246    Tegeler, Canary Brim, MD 06/03/17 [phone]

## 2017-06-02 NOTE — ED Triage Notes (Signed)
Pt states she has a history of high blood pressure and was on BP medicine about 5 years ago.

## 2017-06-02 NOTE — ED Triage Notes (Signed)
Patient reports that she possibly had a seizure around 05/26/17 while in the kitchen and burned her right middle finger.

## 2017-06-02 NOTE — Discharge Instructions (Signed)
Please keep the antibiotic applied to the finger.  I also give your antibiotics to take as well.  Follow-up in 2 days for recheck.  Have also given you follow-[place] in their outpatient clinic you may call for an appointment.  If you develop any worsening symptoms including worsening redness, fevers, worsening pain return to the ED.  Motrin and Tylenol for pain.

## 2017-06-02 NOTE — ED Notes (Signed)
Pt ambulatory and independent at discharge.  Verbalized understanding of discharge instructions 

## 2018-05-23 ENCOUNTER — Encounter (HOSPITAL_COMMUNITY): Payer: Self-pay | Admitting: Emergency Medicine

## 2018-05-23 ENCOUNTER — Emergency Department (HOSPITAL_COMMUNITY)
Admission: EM | Admit: 2018-05-23 | Discharge: 2018-05-24 | Disposition: A | Discharge status: Home / Self Care | Payer: Self-pay | Attending: Emergency Medicine | Admitting: Emergency Medicine

## 2018-05-23 ENCOUNTER — Emergency Department (HOSPITAL_COMMUNITY): Payer: Self-pay

## 2018-05-23 DIAGNOSIS — F419 Anxiety disorder, unspecified: Secondary | ICD-10-CM | POA: Insufficient documentation

## 2018-05-23 DIAGNOSIS — F329 Major depressive disorder, single episode, unspecified: Secondary | ICD-10-CM | POA: Insufficient documentation

## 2018-05-23 DIAGNOSIS — G40909 Epilepsy, unspecified, not intractable, without status epilepticus: Secondary | ICD-10-CM | POA: Insufficient documentation

## 2018-05-23 DIAGNOSIS — I10 Essential (primary) hypertension: Secondary | ICD-10-CM | POA: Insufficient documentation

## 2018-05-23 DIAGNOSIS — Z79899 Other long term (current) drug therapy: Secondary | ICD-10-CM | POA: Insufficient documentation

## 2018-05-23 LAB — COMPREHENSIVE METABOLIC PANEL
ALBUMIN: 3.8 g/dL (ref 3.5–5.0)
ALT: 24 U/L (ref 0–44)
AST: 26 U/L (ref 15–41)
Alkaline Phosphatase: 59 U/L (ref 38–126)
Anion gap: 10 (ref 5–15)
BILIRUBIN TOTAL: 0.6 mg/dL (ref 0.3–1.2)
BUN: 16 mg/dL (ref 6–20)
CHLORIDE: 103 mmol/L (ref 98–111)
CO2: 25 mmol/L (ref 22–32)
Calcium: 8.7 mg/dL — ABNORMAL LOW (ref 8.9–10.3)
Creatinine, Ser: 0.81 mg/dL (ref 0.44–1.00)
GFR calc Af Amer: >60 mL/min (ref >60)
GFR calc non Af Amer: >60 mL/min (ref >60)
GLUCOSE: 123 mg/dL — AB (ref 70–99)
POTASSIUM: 3.6 mmol/L (ref 3.5–5.1)
SODIUM: 138 mmol/L (ref 135–145)
Total Protein: 7.1 g/dL (ref 6.5–8.1)

## 2018-05-23 LAB — RAPID URINE DRUG SCREEN, HOSP PERFORMED
Amphetamines: NOT DETECTED
Barbiturates: NOT DETECTED
Benzodiazepines: NOT DETECTED
Cocaine: NOT DETECTED
Opiates: NOT DETECTED
Tetrahydrocannabinol: NOT DETECTED

## 2018-05-23 LAB — TSH: TSH: 0.729 u[IU]/mL (ref 0.350–4.500)

## 2018-05-23 LAB — I-STAT TROPONIN, ED: TROPONIN I, POC: 0.01 ng/mL (ref 0.00–0.08)

## 2018-05-23 LAB — CBC
HEMATOCRIT: 41 % (ref 36.0–46.0)
Hemoglobin: 13 g/dL (ref 12.0–15.0)
MCH: 27.7 pg (ref 26.0–34.0)
MCHC: 31.7 g/dL (ref 30.0–36.0)
MCV: 87.2 fL (ref 80.0–100.0)
NRBC: 0 % (ref 0.0–0.2)
PLATELETS: 224 10*3/uL (ref 150–400)
RBC: 4.7 MIL/uL (ref 3.87–5.11)
RDW: 15.5 % (ref 11.5–15.5)
WBC: 8 10*3/uL (ref 4.0–10.5)

## 2018-05-23 LAB — URINALYSIS, ROUTINE W REFLEX MICROSCOPIC
BILIRUBIN URINE: NEGATIVE
Glucose, UA: NEGATIVE mg/dL
Hgb urine dipstick: NEGATIVE
KETONES UR: 20 mg/dL — AB
Leukocytes, UA: NEGATIVE
Nitrite: NEGATIVE
Protein, ur: NEGATIVE mg/dL
Specific Gravity, Urine: 1.016 (ref 1.005–1.030)
pH: 6 (ref 5.0–8.0)

## 2018-05-23 LAB — I-STAT BETA HCG BLOOD, ED (MC, WL, AP ONLY): I-stat hCG, quantitative: <5 m[IU]/mL (ref <5)

## 2018-05-23 MED ORDER — LEVETIRACETAM 500 MG PO TABS: 500.0000 mg | ORAL_TABLET | Freq: Two times a day (BID) | ORAL | 1 refills | Status: DC

## 2018-05-23 MED ORDER — LEVETIRACETAM IN NACL 1000 MG/100ML IV SOLN
1000.0000 mg | Freq: Once | INTRAVENOUS | Status: AC
  Administered 2018-05-23: 1000 mg via INTRAVENOUS
  Filled 2018-05-23: qty 100

## 2018-05-23 NOTE — Discharge Instructions (Signed)
1.  A referral order has been placed through the emergency department for [facility].  Call tomorrow to schedule an appointment as soon as possible. 2.  Start taking Keppra as prescribed twice a day. 3.  You may not drive a car, climb on any kind of ladders, go into pools or hot tubs or saunas or any other activity that could result in your injury if you were to have another seizure. 4.  Return to the emergency department if you have any new or concerning symptoms or have recurrent seizures.

## 2018-05-23 NOTE — ED Triage Notes (Signed)
Pt states that sometimes she gets dizzy and will have to take really deep breath to "catch her breath".   Pt refuses to take HTN medications.

## 2018-05-23 NOTE — ED Triage Notes (Signed)
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 Today pt was sitting on cough with granddaughter and had grand mal seizure with shaking and blood coming from nose for about 5 minutes family said the was out of it for about 15 minutes.

## 2018-05-23 NOTE — ED Provider Notes (Signed)
[facility]-EMERGENCY DEPT Provider Note   CSN: [id number] Arrival date & time: 05/23/18  1356     History   Chief Complaint Chief Complaint  Patient presents with  . Seizures    HPI Alison Collins is a 47 y.o. female.  HPI Patient does not have a known history of seizure disorder.  She however reports that for about 3 years now she has been having suspected seizures.  She reports that she will wake up in the morning and have little areas inside her mouth that are bleeding where she is probably bitten them.  Her husband has observed her to move her arms and be zoned out for a period of time and then come back to normal.  Patient is never aware of these episodes when that happened.  She only knows about it because other people tell her.  Tonight, patient's husband reports that he saw her have a full seizure moving her arms and her legs.  He reports he has never seen at this extreme before.  He reports the whole episode lasted about 15 minutes and for about 5 minutes or so she seemed to be sound asleep and hard to awaken.  Then she came back around and was alert and oriented.  Patient reports that if she had not been told about it, she would know what had happened.  Patient reports she has had some head injuries in her younger years but never had any delayed problems that she was aware of.  She denies she has any problems with recurrent headaches.  She has not had problems with her visions or her balance.  She reports most the time she feels really good.  She denies any drug or alcohol use. Past Medical History:  Diagnosis Date  . Anemia   . GDM (gestational diabetes mellitus)   . Hypertension   . Migraine   . Normal spontaneous vaginal delivery   . Seizures (HCC)   . Spontaneous abortion    2    Patient Active Problem List   Diagnosis Date Noted  . SUI (stress urinary incontinence, female) 01/25/2011  . Hypertension 01/25/2011  . Depression 01/25/2011  .  Anxiety 01/25/2011    Past Surgical History:  Procedure Laterality Date  . BREAST SURGERY    . LIPOSUCTION       OB History    Gravida  3   Para  1   Term      Preterm      AB  2   Living  1     SAB  2   TAB      Ectopic      Multiple      Live Births               Home Medications    Prior to Admission medications   Medication Sig Start Date End Date Taking? Authorizing Provider  Multiple Vitamins-Minerals (WOMENS MULTI VITAMIN & MINERAL PO) Take 1 tablet by mouth daily.   Yes [provider]  albuterol (PROVENTIL HFA;VENTOLIN HFA) 108 (90 Base) MCG/ACT inhaler Inhale 1-2 puffs into the lungs every 6 (six) hours as needed for wheezing or shortness of breath. Patient not taking: Reported on 05/23/2018 05/12/17   Demetrios Loll T, PA-C  benzonatate (TESSALON) 100 MG capsule Take 1 capsule (100 mg total) by mouth every 8 (eight) hours. Patient not taking: Reported on 05/23/2018 05/12/17   Demetrios Loll T, PA-C  doxycycline (VIBRAMYCIN) 100 MG capsule Take  1 capsule (100 mg total) by mouth 2 (two) times daily. Patient not taking: Reported on 05/23/2018 06/02/17   Demetrios LollLeaphart, Kenneth T, PA-C  levETIRAcetam (KEPPRA) 500 MG tablet Take 1 tablet (500 mg total) by mouth 2 (two) times daily. 05/23/18   Arby BarrettePfeiffer, Kratos Ruscitti, MD  predniSONE (DELTASONE) 20 MG tablet Take 2 tablets (40 mg total) by mouth daily with breakfast. Patient not taking: Reported on 05/23/2018 05/12/17   Rise MuLeaphart, Kenneth T, PA-C    Family History Family History  Problem Relation Age of Onset  . Hypertension Mother   . Heart disease Father     Social History Social History   Tobacco Use  . Smoking status: Never Smoker  . Smokeless tobacco: Never Used  Substance Use Topics  . Alcohol use: No    Frequency: Never    Comment: former  . Drug use: No     Allergies   Penicillins   Review of Systems Review of Systems 10 Systems reviewed and are negative for acute change  except as noted in the HPI.  Physical Exam Updated Vital Signs BP 135/80 (BP Location: Left Arm)   Pulse 79   Temp 98.2 F (36.8 C) (Oral)   Resp (!) 21   Ht 5\' 2"  (1.575 m)   LMP 05/16/2017 (Approximate)   SpO2 96%   BMI 32.01 kg/m   Physical Exam Constitutional:      Appearance: Normal appearance.  HENT:     Head: Normocephalic and atraumatic.     Right Ear: Tympanic membrane normal.     Left Ear: Tympanic membrane normal.     Mouth/Throat:     Mouth: Mucous membranes are moist.  Eyes:     Extraocular Movements: Extraocular movements intact.     Pupils: Pupils are equal, round, and reactive to light.  Neck:     Musculoskeletal: Normal range of motion.  Cardiovascular:     Rate and Rhythm: Normal rate and regular rhythm.     Heart sounds: Normal heart sounds.  Pulmonary:     Effort: Pulmonary effort is normal.     Breath sounds: Normal breath sounds.  Abdominal:     General: There is no distension.     Palpations: Abdomen is soft.     Tenderness: There is no abdominal tenderness.  Musculoskeletal: Normal range of motion.        General: No swelling, tenderness, deformity or signs of injury.     Right lower leg: No edema.     Left lower leg: No edema.  Skin:    General: Skin is warm and dry.  Neurological:     General: No focal deficit present.     Mental Status: She is alert. Mental status is at baseline. She is disoriented.     Cranial Nerves: No cranial nerve deficit.     Sensory: No sensory deficit.     Motor: No weakness.     Coordination: Coordination normal.     Gait: Gait normal.  Psychiatric:        Mood and Affect: Mood normal.      ED Treatments / Results  Labs (all labs ordered are listed, but only abnormal results are displayed) Labs Reviewed  COMPREHENSIVE METABOLIC PANEL - Abnormal; Notable for the following components:      Result Value   Glucose, Bld 123 (*)    Calcium 8.7 (*)    All other components within normal limits    URINALYSIS, ROUTINE W REFLEX MICROSCOPIC - Abnormal; Notable for  the following components:   Ketones, ur 20 (*)    All other components within normal limits  CBC  RAPID URINE DRUG SCREEN, HOSP PERFORMED  TSH  I-STAT BETA HCG BLOOD, ED ([facility], [facility], [facility] ONLY)  I-STAT TROPONIN, ED    EKG EKG Interpretation  Date/Time:  Tuesday May 23 2018 22:25:00 EST Ventricular Rate:  82 PR Interval:    QRS Duration: 98 QT Interval:  364 QTC Calculation: 426 R Axis:   16 Text Interpretation:  Sinus rhythm Probable left atrial enlargement Probable anteroseptal infarct, old When compared with ECG of EARLIER SAME DATE No significant change was found Confirmed by Dione Booze ([id number]) on 05/23/2018 11:47:40 PM   Radiology Ct Head Wo Contrast  Result Date: 05/23/2018 CLINICAL DATA:  Seizure, new, nontraumatic, >40 yrs EXAM: CT HEAD WITHOUT CONTRAST TECHNIQUE: Contiguous axial images were obtained from the base of the skull through the vertex without intravenous contrast. COMPARISON:  Head CT 06/13/2015 FINDINGS: Brain: Ventricular enlargement for age with progressive dilatation of the lateral ventricles from prior exam. Prominent third ventricle is unchanged. Normal sized fourth ventricle and basilar cisterns. No intracranial hemorrhage, mass effect, or midline shift. Partially empty sella. Gray-white differentiation is preserved. No evidence of territorial infarct or acute ischemia. No extra-axial or intracranial fluid collection. Vascular: No hyperdense vessel or unexpected calcification. Skull: No fracture or focal lesion. Sinuses/Orbits: Paranasal sinuses and mastoid air cells are clear. The visualized orbits are unremarkable. Other: None. IMPRESSION: 1. Ventricular enlargement for age with mild progressive dilatation lateral ventricles from 2017 exam. Recommend MRI of the brain for further evaluation. 2. No hemorrhage or evidence of acute ischemia. Electronically Signed   By: Narda Rutherford M.D.   On:  05/23/2018 21:41    Procedures Procedures (including critical care time)  Medications Ordered in ED Medications  levETIRAcetam (KEPPRA) IVPB 1000 mg/100 mL premix (1,000 mg Intravenous New Bag/Given 05/23/18 2307)     Initial Impression / Assessment and Plan / ED Course  I have reviewed the triage vital signs and the nursing notes.  Pertinent labs & imaging results that were available during my care of the patient were reviewed by me and considered in my medical decision making (see chart for details).    Consult: Reviewed with Dr. Jerrell Belfast neurology.  He advises to treat the patient with a gram of Keppra IV and start Keppra 500 twice daily.  As patient has completely returned to normal baseline, can continue remainder of work-up on outpatient basis.  Patient is alert and appropriate.  She has no positive findings on neurologic exam.  Patient describes partial seizures most likely over the past couple years.  Tonight she had a full seizure as observed by her husband.  Patient loaded with Keppra as instructed and will be started on outpatient.  She will follow-up with neurology.  Patient is alert and appropriate condition at discharge.  Final Clinical Impressions(s) / ED Diagnoses   Final diagnoses:  Seizure disorder [facility])    ED Discharge Orders         Ordered    levETIRAcetam (KEPPRA) 500 MG tablet  2 times daily     05/23/18 2338    Ambulatory referral to Neurology    Comments:  An appointment is requested in approximately:1 week   05/23/18 2339           Arby Barrette, MD 05/23/18 2350

## 2018-05-23 NOTE — ED Notes (Signed)
Patient transported to CT 

## 2018-05-23 NOTE — ED Notes (Signed)
ED provider at bedside.

## 2018-07-12 ENCOUNTER — Ambulatory Visit: Payer: Self-pay | Admitting: Neurology

## 2018-07-12 ENCOUNTER — Encounter: Payer: Self-pay | Admitting: Neurology

## 2018-07-12 VITALS — BP 169/92 | HR 68 | Ht 62.0 in | Wt 222.0 lb

## 2018-07-12 DIAGNOSIS — G9340 Encephalopathy, unspecified: Secondary | ICD-10-CM

## 2018-07-12 DIAGNOSIS — G40109 Localization-related (focal) (partial) symptomatic epilepsy and epileptic syndromes with simple partial seizures, not intractable, without status epilepticus: Secondary | ICD-10-CM

## 2018-07-12 DIAGNOSIS — G40209 Localization-related (focal) (partial) symptomatic epilepsy and epileptic syndromes with complex partial seizures, not intractable, without status epilepticus: Secondary | ICD-10-CM | POA: Insufficient documentation

## 2018-07-12 MED ORDER — DIVALPROEX SODIUM ER 500 MG PO TB24: 500.0000 mg | ORAL_TABLET | Freq: Two times a day (BID) | ORAL | 6 refills | Status: DC

## 2018-07-12 NOTE — Progress Notes (Signed)
[facility]    Provider:  Dr Lucia GaskinsAhern Referring Provider: Emergency room Primary Care Provider:  Patient, No Pcp Per  CC:  Seizures  HPI:  Alison Collins is a 48 y.o. female here as requested by the emergency room for seizures. PMHx HTN, Anxiety, Depression, migraine, seizures since a child. Recently started on Keppra; Keppra makes her tired and sleepy. She is not driving. She is here with her husband who also provides information. She wakes up with sores in her mouth. She feels out of body experiences. Have been ongoing since high school. She has been to the emergency room multiple times at least as far back as 2016 for seizures. She says she has not been treated until recently. She used to zone out, no myoclonus, she drools, she zones out. Keppra has stopped the seizures but she feels worse. Hisband says the last seizure she was on the couch, she remembers putting her granddaughter to sleep and nothing else. Husband says the granddaughter started crying, patient was on the couch and laying there and she starts biting and chewing, eyes are closed, never open, the upper extremities were straight and rhythmic bouncing, she stopped breathing, blood coming out of the nose, it lasted for 5 minutes, she was non responsive and daughter took her to the ED. Sister had epilepsy and died while sleeping. No other focal neurologic deficits, associated symptoms, inciting events or modifiable factors.  Reviewed notes, labs and imaging from outside physicians, which showed:  Reviewed emergency room notes.  Patient was seen in the emergency room without a known history of seizure disorder.  For 3 years she has been having suspected seizures.  She wakes up in the morning and has little areas inside her mouth that are bleeding where she probably bit them.  Her husband has observed her to move her arms and be zoned out for a period of time and then come back to normal.  She is not aware of these  episodes.  Days she went to the emergency room her husband reported she had a full seizure moving her arms and her legs this was more extreme than before.  The whole episode lasted about 15 minutes and for about 5 minutes or so she seemed to be sound asleep and hard to awaken.  Then she came back and she was alert and oriented.  Patient does not remember it.  Patient was treated with Keppra IV and was started on Keppra 500 mg twice daily.  On review she has been to the emergency room multiple times for seizure-like activity as far back as early 2016.    CT 06/2015 showed No acute intracranial abnormalities including mass lesion or mass effect, hydrocephalus, extra-axial fluid collection, midline shift, hemorrhage, or acute infarction, large ischemic events (personally reviewed images)   Review of Systems: Patient complains of symptoms per HPI as well as the following symptoms: seizures. Pertinent negatives and positives per HPI. All others negative.   Social History   Socioeconomic History  . Marital status: Significant Other    Spouse name: Not on file  . Number of children: 1  . Years of education: Not on file  . Highest education level: Not on file  Occupational History  . Not on file  Social Needs  . Financial resource strain: Not on file  . Food insecurity:    Worry: Not on file    Inability: Not on file  . Transportation needs:    Medical: Not on file  Non-medical: Not on file  Tobacco Use  . Smoking status: Never Smoker  . Smokeless tobacco: Never Used  Substance and Sexual Activity  . Alcohol use: Yes    Frequency: Never    Comment: sometimes  . Drug use: No  . Sexual activity: Not Currently    Birth control/protection: None  Lifestyle  . Physical activity:    Days per week: Not on file    Minutes per session: Not on file  . Stress: Not on file  Relationships  . Social connections:    Talks on phone: Not on file    Gets together: Not on file    Attends  religious service: Not on file    Active member of club or organization: Not on file    Attends meetings of clubs or organizations: Not on file    Relationship status: Not on file  . Intimate partner violence:    Fear of current or ex partner: Not on file    Emotionally abused: Not on file    Physically abused: Not on file    Forced sexual activity: Not on file  Other Topics Concern  . Not on file  Social History Narrative   Lives at home with [name] her s.o.   Right handed   Caffeine: sometimes    Family History  Problem Relation Age of Onset  . Hypertension Mother   . Cancer Mother   . Heart disease Father   . Seizures Sister        epilepsy    Past Medical History:  Diagnosis Date  . Anemia   . GDM (gestational diabetes mellitus)   . Hypertension   . Migraine   . Normal spontaneous vaginal delivery   . Seizures (HCC)   . Spontaneous abortion    2    Patient Active Problem List   Diagnosis Date Noted  . 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 SUI (stress urinary incontinence, female) 01/25/2011  . Hypertension 01/25/2011  . Depression 01/25/2011  . Anxiety 01/25/2011    Past Surgical History:  Procedure Laterality Date  . BREAST SURGERY    . LIPOSUCTION      Current Outpatient Medications  Medication Sig Dispense Refill  . albuterol (PROVENTIL HFA;VENTOLIN HFA) 108 (90 Base) MCG/ACT inhaler Inhale 1-2 puffs into the lungs every 6 (six) hours as needed for wheezing or shortness of breath. (Patient not taking: Reported on 05/23/2018) 1 Inhaler 0  . divalproex (DEPAKOTE ER) 500 MG 24 hr tablet Take 1 tablet (500 mg total) by mouth 2 (two) times daily. 60 tablet 6   No current facility-administered medications for this visit.     Allergies as of 07/12/2018 - Review Complete 07/12/2018  Allergen Reaction Noted  . Gluten meal  07/12/2018  . Penicillins Swelling 12/07/2010    Vitals: BP (!) 169/92 (BP  Location: Right Arm, Patient Position: Sitting)   Pulse 68   Ht 5\' 2"  (1.575 m)   Wt 222 lb (100.7 kg)   LMP 05/16/2017 (Approximate)   BMI 40.60 kg/m  Last Weight:  Wt Readings from Last 1 Encounters:  07/12/18 222 lb (100.7 kg)   Last Height:   Ht Readings from Last 1 Encounters:  07/12/18 5\' 2"  (1.575 m)     Physical exam: Exam: Gen: NAD, conversant, well nourised, obese, well groomed                     CV:  RRR, no MRG. No Carotid Bruits. No peripheral edema, warm, nontender Eyes: Conjunctivae clear without exudates or hemorrhage  Neuro: Detailed Neurologic Exam  Speech:    Speech is normal; fluent and spontaneous with normal comprehension.  Cognition:    The patient is oriented to person, place, and time;     recent and remote memory intact;     language fluent;     normal attention, concentration,     fund of knowledge Cranial Nerves:    The pupils are equal, round, and reactive to light. The fundi are normal and spontaneous venous pulsations are present. Visual fields are full to finger confrontation. Extraocular movements are intact. Trigeminal sensation is intact and the muscles of mastication are normal. The face is symmetric. The palate elevates in the midline. Hearing intact. Voice is normal. Shoulder shrug is normal. The tongue has normal motion without fasciculations.   Coordination:    Normal finger to nose and heel to shin. Normal rapid alternating movements.   Gait:    Heel-toe and tandem gait are normal.   Motor Observation:    No asymmetry, no atrophy, and no involuntary movements noted. Tone:    Normal muscle tone.    Posture:    Posture is normal. normal erect    Strength:    Strength is V/V in the upper and lower limbs.      Sensation: intact to LT     Reflex Exam:  DTR's:    Deep tendon reflexes in the upper and lower extremities are normal bilaterally.   Toes:    The toes are downgoing bilaterally.   Clonus:    Clonus is absent.     Assessment/Plan:  48 y.o. female here as requested by the emergency room for seizures. PMHx HTN, Anxiety, Depression, migraine, seizures since a child. Patient needs thorough evaluation and likely AEDs for life.  - Start Depakote 500mg  twice daily - Stop the Keppra in 2 days (need to overlap for several days) - Patient is unable to drive, operate heavy machinery, perform activities at heights or participate in water activities until 6 months to one year seizure free -Patients with epilepsy have a small risk of sudden unexpected death, a condition referred to as sudden unexpected death in epilepsy (SUDEP). SUDEP is defined specifically as the sudden, unexpected, witnessed or unwitnessed, nontraumatic and nondrowning death in patients with epilepsy with or without evidence for a seizure, and excluding documented status epilepticus, in which post mortem examination does not reveal a structural or toxicologic cause for death  - [facility] financial assistance - MRI brain and EEG - Gave [employer] coupon for the medication < $20 at [employer] - RTC 3 months - EEG - MRI brain w/wo contrast - Needs a pcp, discussed  Orders Placed This Encounter  Procedures  . MR BRAIN W WO CONTRAST  . EEG    Orders Placed This Encounter  Procedures  . MR BRAIN W WO CONTRAST    Standing Status:   Future    Standing Expiration Date:   01/10/2019    Scheduling Instructions:     Patient is applying for [facility] financial assistance please schedule and give time for her to get approved.    Order Specific Question:   If indicated for the ordered procedure, I authorize the administration of contrast media per Radiology protocol    Answer:   Yes    Order Specific Question:   What is the patient's sedation requirement?    Answer:  No Sedation    Order Specific Question:   Does the patient have a pacemaker or implanted devices?    Answer:   No    Order Specific Question:   Radiology Contrast Protocol - do NOT remove file  path    Answer:   [email or web address]    Order Specific Question:   ** REASON FOR EXAM (FREE TEXT)    Answer:   seizures    Order Specific Question:   Preferred imaging location?    Answer:   External  . EEG    Standing Status:   Future    Standing Expiration Date:   07/13/2019    Scheduling Instructions:     Patient applying for [facility] financial assistance schedule for 2 weeks or later to give time to get that done   Meds ordered this encounter  Medications  . DISCONTD: divalproex (DEPAKOTE ER) 500 MG 24 hr tablet    Sig: Take 1 tablet (500 mg total) by mouth 2 (two) times daily.    Dispense:  60 tablet    Refill:  6  . divalproex (DEPAKOTE ER) 500 MG 24 hr tablet    Sig: Take 1 tablet (500 mg total) by mouth 2 (two) times daily.    Dispense:  60 tablet    Refill:  6    Cc: Arby Barrette, MD,  Patient, No Pcp Per  Naomie Dean, MD  [facility] [address] [place], [place] [address]  Phone [phone] Fax [phone]

## 2018-07-12 NOTE — Patient Instructions (Addendum)
- Start Depakote 500mg  twice daily - Stop the Keppra in 2 days (need to overlap for several days) - Patient is unable to drive, operate heavy machinery, perform activities at heights or participate in water activities until 6 months to one year seizure free -Patients with epilepsy have a small risk of sudden unexpected death, a condition referred to as sudden unexpected death in epilepsy (SUDEP). SUDEP is defined specifically as the sudden, unexpected, witnessed or unwitnessed, nontraumatic and nondrowning death in patients with epilepsy with or without evidence for a seizure, and excluding documented status epilepticus, in which post mortem examination does not reveal a structural or toxicologic cause for death  - [facility] financial assistance - MRI brain and EEG - Gave GoodRX coupon for the medication < $20 at [employer] - RTC 3 months   Seizure, Adult When you have a seizure:  Parts of your body may move.  You may have a change in how aware or awake (conscious) you are.  You may shake (convulse). Seizures usually last from 30 seconds to 2 minutes. Usually, they are not harmful unless they last a long time. What are the signs or symptoms? Common symptoms of this condition include:  Shaking (convulsions).  Stiffness in the body.  Passing out (losing consciousness).  Uncontrolled movements in the: ? Arms or legs. ? Eyes. ? Head. ? Mouth. Some people have symptoms right before a seizure happens. These symptoms may include:  Fear.  Worry (anxiety).  Feeling like you are going to throw up (nausea).  Feeling like the room is spinning (vertigo).  Feeling like you saw or heard something before (dj vu).  Odd tastes or smells.  Changes in vision, such as seeing flashing lights or spots. Follow these instructions at home: Medicines   Take over-the-counter and prescription medicines only as told by your doctor.  Do not eat or drink anything that may keep your medicine  from working, such as alcohol. Activity  Do not do any activities that would be dangerous if you had another seizure, like driving or swimming. Wait until your doctor says it is safe for you to do them.  If you live in the [place] ask your local DMV (department of motor vehicles) when you can drive.  Get plenty of rest. Teaching others   Teach friends and family what to do when you have a seizure. They should: ? Lay you on the ground. ? Protect your head and body. ? Loosen any tight clothing around your neck. ? Turn you on your side. ? Not hold you down. ? Not put anything into your mouth. ? Know whether or not you need emergency care. ? Stay with you until you are better. General instructions  Contact your doctor each time you have a seizure.  Avoid anything that gives you seizures.  Keep a seizure diary. Write down: ? What you think caused each seizure. ? What you remember about each seizure.  Keep all follow-up visits as told by your doctor. This is important. Contact a doctor if:  You have another seizure.  You have seizures more often.  There is any change in what happens during your seizures.  You keep having seizures with treatment.  You have symptoms of being sick or having an infection. Get help right away if:  You have a seizure: ? That lasts longer than 5 minutes. ? That is different than seizures you had before. ? That makes it harder to breathe. ? After you hurt your head.  After a seizure, you cannot speak or use a part of your body.  After a seizure, you are confused or have a bad headache.  You have two or more seizures in a row.  You are having seizures more often.  You do not wake up right after a seizure.  You get hurt during a seizure. In an emergency:  These symptoms may be an emergency. Do not wait to see if the symptoms will go away. Get medical help right away. Call your local emergency services (911 in the [place] Do not drive  yourself to the hospital. Summary  Seizures usually last from 30 seconds to 2 minutes. Usually, they are not harmful unless they last a long time.  Do not eat or drink anything that may keep your medicine from working, such as alcohol.  Teach friends and family what to do when you have a seizure.  Contact your doctor each time you have a seizure. This information is not intended to replace advice given to you by your health care provider. Make sure you discuss any questions you have with your health care provider. Document Released: 11/10/2007 Document Revised: 02/15/2018 Document Reviewed: 06/30/2017 Elsevier Interactive Patient Education  2019 Elsevier Inc.  Valproic Acid, Divalproex Sodium delayed or extended-release tablets What is this medicine? DIVALPROEX SODIUM (dye VAL pro ex SO dee um) is used to prevent seizures caused by some forms of epilepsy. It is also used to treat bipolar mania and to prevent migraine headaches. This medicine may be used for other purposes; ask your health care provider or pharmacist if you have questions. COMMON BRAND NAME(S): Depakote, Depakote ER What should I tell my health care provider before I take this medicine? They need to know if you have any of these conditions: -if you often drink alcohol -kidney disease -liver disease -low platelet counts -mitochondrial disease -suicidal thoughts, plans, or attempt; a previous suicide attempt by you or a family member -urea cycle disorder (UCD) -an unusual or allergic reaction to divalproex sodium, sodium valproate, valproic acid, other medicines, foods, dyes, or preservatives -pregnant or trying to get pregnant -breast-feeding How should I use this medicine? Take this medicine by mouth with a drink of water. Follow the directions on the prescription label. Do not cut, crush or chew this medicine. You can take it with or without food. If it upsets your stomach, take it with food. Take your medicine at  regular intervals. Do not take it more often than directed. Do not stop taking except on your doctor's advice. A special MedGuide will be given to you by the pharmacist with each prescription and refill. Be sure to read this information carefully each time. Talk to your pediatrician regarding the use of this medicine in children. While this drug may be prescribed for children as young as 10 years for selected conditions, precautions do apply. Overdosage: If you think you have taken too much of this medicine contact a poison control center or emergency room at once. NOTE: This medicine is only for you. Do not share this medicine with others. What if I miss a dose? If you miss a dose, take it as soon as you can. If it is almost time for your next dose, take only that dose. Do not take double or extra doses. What may interact with this medicine? Do not take this medicine with any of the following medications: -sodium phenylbutyrate This medicine may also interact with the following medications: -aspirin -certain antibiotics like ertapenem, imipenem, meropenem -  certain medicines for depression, anxiety, or psychotic disturbances -certain medicines for seizures like carbamazepine, clonazepam, diazepam, ethosuximide, felbamate, lamotrigine, phenobarbital, phenytoin, primidone, rufinamide, topiramate -certain medicines that treat or prevent blood clots like warfarin -cholestyramine -female hormones, like estrogens and birth control pills, patches, or rings -propofol -rifampin -ritonavir -tolbutamide -zidovudine This list may not describe all possible interactions. Give your health care provider a list of all the medicines, herbs, non-prescription drugs, or dietary supplements you use. Also tell them if you smoke, drink alcohol, or use illegal drugs. Some items may interact with your medicine. What should I watch for while using this medicine? Tell your doctor or health care professional if your  symptoms do not get better or they start to get worse. Wear a medical ID bracelet or chain, and carry a card that describes your disease and details of your medicine and dosage times. You may get drowsy, dizzy, or have blurred vision. Do not drive, use machinery, or do anything that needs mental alertness until you know how this medicine affects you. To reduce dizzy or fainting spells, do not sit or stand up quickly, especially if you are an older patient. Alcohol can increase drowsiness and dizziness. Avoid alcoholic drinks. This medicine can make you more sensitive to the sun. Keep out of the sun. If you cannot avoid being in the sun, wear protective clothing and use sunscreen. Do not use sun lamps or tanning beds/booths. Patients and their families should watch out for new or worsening depression or thoughts of suicide. Also watch out for sudden changes in feelings such as feeling anxious, agitated, panicky, irritable, hostile, aggressive, impulsive, severely restless, overly excited and hyperactive, or not being able to sleep. If this happens, especially at the beginning of treatment or after a change in dose, call your health care professional. Women should inform their doctor if they wish to become pregnant or think they might be pregnant. There is a potential for serious side effects to an unborn child. Talk to your health care professional or pharmacist for more information. Women who become pregnant while using this medicine may enroll in the [place] American Antiepileptic Drug Pregnancy Registry by calling [phone]. This registry collects information about the safety of antiepileptic drug use during pregnancy. This medicine may cause a decrease in folic acid and vitamin D. You should make sure that you get enough vitamins while you are taking this medicine. Discuss the foods you eat and the vitamins you take with your health care professional. What side effects may I notice from receiving this  medicine? Side effects that you should report to your doctor or health care professional as soon as possible: -allergic reactions like skin rash, itching or hives, swelling of the face, lips, or tongue -changes in vision -redness, blistering, peeling or loosening of the skin, including inside the mouth -signs and symptoms of liver injury like dark yellow or brown urine; general ill feeling or flu-like symptoms; light-colored stools; loss of appetite; nausea; right upper belly pain; unusually weak or tired; yellowing of the eyes or skin -suicidal thoughts or other mood changes -unusual bleeding or bruising Side effects that usually do not require medical attention (report to your doctor or health care professional if they continue or are bothersome): -constipation -diarrhea -dizziness -hair loss -headache -loss of appetite -weight gain This list may not describe all possible side effects. Call your doctor for medical advice about side effects. You may report side effects to FDA at 1-800-FDA-1088. Where should I keep my medicine?  Keep out of reach of children. Store at room temperature between 15 and 30 degrees C (59 and 86 degrees F). Keep container tightly closed. Throw away any unused medicine after the expiration date. NOTE: This sheet is a summary. It may not cover all possible information. If you have questions about this medicine, talk to your doctor, pharmacist, or health care provider.  2019 Elsevier/Gold Standard (2017-02-14 10:48:27)

## 2018-07-14 ENCOUNTER — Telehealth: Payer: Self-pay | Admitting: Neurology

## 2018-07-14 NOTE — Telephone Encounter (Signed)
self pay order sent to [facility] . They will reach out to the pt to schedule.  °

## 2018-08-14 ENCOUNTER — Other Ambulatory Visit: Payer: Self-pay

## 2018-08-14 ENCOUNTER — Encounter: Payer: Self-pay | Admitting: Neurology

## 2018-10-11 ENCOUNTER — Ambulatory Visit: Payer: Self-pay | Admitting: Neurology

## 2018-10-11 ENCOUNTER — Other Ambulatory Visit: Payer: Self-pay | Admitting: Neurology

## 2018-10-11 ENCOUNTER — Telehealth: Payer: Self-pay | Admitting: *Deleted

## 2018-10-11 MED ORDER — DIVALPROEX SODIUM ER 500 MG PO TB24: 500.0000 mg | ORAL_TABLET | Freq: Two times a day (BID) | ORAL | 4 refills | Status: DC

## 2018-10-11 NOTE — Telephone Encounter (Signed)
Called pt to discuss converting to a virtual visit for her appt rather than coming into the office d/t covid 19 pandemic. Pt stated that she doesn't have health insurance and she will call back to reschedule when she does. I advised pt that she still has depakote refills left and I advised her not to stop her medication abruptly. Pt verbalized understanding and stated she was still able to get her medication. Pt aware of resources like goodrx, [facility] financial assistance. I encouraged pt to use these and advised if she would like to be self pay we can do that and she would need to have $200. She verbalized understanding and appreciation. She will call back later.

## 2018-10-11 NOTE — Telephone Encounter (Signed)
Looks like she has a few refills left already.

## 2018-10-11 NOTE — Telephone Encounter (Signed)
We can give her a refill if needed. Totally understand at this time that patient's may have lost teir health insurance.

## 2018-10-12 ENCOUNTER — Other Ambulatory Visit: Payer: Self-pay | Admitting: *Deleted

## 2018-10-12 MED ORDER — DIVALPROEX SODIUM ER 500 MG PO TB24: 500.0000 mg | ORAL_TABLET | Freq: Two times a day (BID) | ORAL | 4 refills | Status: DC

## 2020-12-03 ENCOUNTER — Emergency Department (HOSPITAL_COMMUNITY): Payer: Self-pay

## 2020-12-03 ENCOUNTER — Other Ambulatory Visit: Payer: Self-pay

## 2020-12-03 ENCOUNTER — Encounter (HOSPITAL_COMMUNITY): Payer: Self-pay | Admitting: Emergency Medicine

## 2020-12-03 ENCOUNTER — Emergency Department (HOSPITAL_COMMUNITY)
Admission: EM | Admit: 2020-12-03 | Discharge: 2020-12-04 | Disposition: A | Discharge status: Home / Self Care | Payer: Self-pay | Attending: Emergency Medicine | Admitting: Emergency Medicine

## 2020-12-03 DIAGNOSIS — I10 Essential (primary) hypertension: Secondary | ICD-10-CM | POA: Insufficient documentation

## 2020-12-03 DIAGNOSIS — M1712 Unilateral primary osteoarthritis, left knee: Secondary | ICD-10-CM | POA: Insufficient documentation

## 2020-12-03 MED ORDER — ACETAMINOPHEN 325 MG PO TABS
650.0000 mg | ORAL_TABLET | Freq: Once | ORAL | Status: AC
  Administered 2020-12-03: 650 mg via ORAL
  Filled 2020-12-03: qty 2

## 2020-12-03 NOTE — ED Provider Notes (Signed)
Emergency Medicine Provider Triage Evaluation Note  Alison Collins , a 50 y.o. female  was evaluated in triage.  Pt complains of left knee pain.  The patient endorses gradual onset, gradually worsening left knee pain that began over the last few days.  She reports associated swelling.  Pain is worse with standing and range of motion of the knee.  Improved with sitting and not changing position.  No numbness, weakness, fever, chills, redness, swelling.  No known trauma or injury.  No history of previous knee surgery.  She has been drinking tea with lemon without improvement in her symptoms.  No other treatment prior to arrival.  Review of Systems  Positive: Arthralgias, myalgias Negative: Fever, chills, wounds, warmth, color change, numbness, weakness  Physical Exam  BP (!) 169/88 (BP Location: Left Arm)   Pulse 71   Temp 98.4 F (36.9 C) (Oral)   Resp 16   LMP 05/16/2017 (Approximate)   SpO2 97%  Gen:   Awake, no distress   Resp:  Normal effort  MSK:   Point tenderness to palpation to the lateral inferior patella on the left knee.  No medial or lateral joint line tenderness.  Full active and passive range of motion.  Pain with range of motion.  Patient is able to stand and bear weight, but has an antalgic gait.  No tenderness to the left ankle or knee. Other: Well-healed scar noted to the skin overlying the left knee.  DP and PT pulses are 2+ and symmetric.  Sensation is intact and equal.  No erythema, warmth.   Medical Decision Making  Medically screening exam initiated at 10:29 PM.  Appropriate orders placed.  Jalisha Enneking was informed that the remainder of the evaluation will be completed by another provider, this initial triage assessment does not replace that evaluation, and the importance of remaining in the ED until their evaluation is complete.  Will order x-ray of the left knee.  Tylenol given for pain control.  She will require further work-up and evaluation in the emergency  department.   Frederik Pear A, PA-C 12/03/20 2232    Virgina Norfolk, DO 12/03/20 2318

## 2020-12-03 NOTE — ED Triage Notes (Signed)
Pt arrive POV from home with increase swollen and pain on left knee denies any injury. Pt report getting worse today started 4 days ago.

## 2020-12-04 MED ORDER — METHOCARBAMOL 500 MG PO TABS
500.0000 mg | ORAL_TABLET | Freq: Once | ORAL | Status: AC
  Administered 2020-12-04: 500 mg via ORAL
  Filled 2020-12-04: qty 1

## 2020-12-04 MED ORDER — METHOCARBAMOL 500 MG PO TABS: 500.0000 mg | ORAL_TABLET | Freq: Two times a day (BID) | ORAL | 0 refills | Status: DC

## 2020-12-04 MED ORDER — PREDNISONE 10 MG (21) PO TBPK: ORAL_TABLET | Freq: Every day | ORAL | 0 refills | Status: DC

## 2020-12-04 MED ORDER — KETOROLAC TROMETHAMINE 60 MG/2ML IM SOLN
60.0000 mg | Freq: Once | INTRAMUSCULAR | Status: AC
  Administered 2020-12-04: 60 mg via INTRAMUSCULAR
  Filled 2020-12-04: qty 2

## 2020-12-04 NOTE — ED Notes (Signed)
E-signature pad unavailable at time of pt discharge. This RN discussed discharge materials with pt and answered all pt questions. Pt stated understanding of discharge material. ? ?

## 2020-12-04 NOTE — ED Provider Notes (Signed)
[facility] EMERGENCY DEPARTMENT Provider Note   CSN: [id number] Arrival date & time: 12/03/20  2208     History Chief Complaint  Patient presents with   Knee Pain    Alison Collins is a 50 y.o. female with a history of seizures, HTN, anemia who presents emergency department with a chief complaint of left knee pain.  The patient endorses constant, gradually worsening left knee pain over the last few days.  She endorses associated swelling.  States that pain is worse with standing and range of motion of the knee.  Pain is improved with sitting and holding the knee still.  No recent falls, injuries, new exercises, or activities.  She denies numbness, weakness, fever, chills, redness, swelling, left ankle pain, hip pain, calf pain, swelling noted to the lower leg, her right leg pain.  No history of similar.  No history of previous knee surgery.  She reports that she watched a YouTube video where she should drink cucumber, lemon, lime, and water to help with her health.  She does state that symptoms began around the second day that she started this regimen.  No history of gout.  No IV drug use.  No treatment prior to arrival.  The history is provided by medical records. No language interpreter was used.      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   SUI (stress urinary incontinence, female) 01/25/2011   Hypertension 01/25/2011   Depression 01/25/2011   Anxiety 01/25/2011    Past Surgical History:  Procedure Laterality Date   BREAST SURGERY     LIPOSUCTION       OB History     Gravida  3   Para  1   Term      Preterm      AB  2   Living  1      SAB   2   IAB      Ectopic      Multiple      Live Births              Family History  Problem Relation Age of Onset   Hypertension Mother    Cancer Mother    Heart disease Father    Seizures Sister        epilepsy    Social History   Tobacco Use   Smoking status: Never   Smokeless tobacco: Never  Vaping Use   Vaping Use: Never used  Substance Use Topics   Alcohol use: Yes    Comment: sometimes   Drug use: No    Home Medications Prior to Admission medications   Medication Sig Start Date End Date Taking? Authorizing Provider  methocarbamol (ROBAXIN) 500 MG tablet Take 1 tablet (500 mg total) by mouth 2 (two) times daily. 12/04/20  Yes Aras Albarran A, PA-C  predniSONE (STERAPRED UNI-PAK 21 TAB) 10 MG (21) TBPK tablet Take by mouth daily. Take 6 tabs by mouth daily  for 2 days, then 5 tabs for 2 days, then 4 tabs for 2 days, then 3 tabs for 2 days, 2 tabs for 2 days,  then 1 tab by mouth daily for 2 days 12/04/20  Yes Maveric Debono A, PA-C  divalproex (DEPAKOTE ER) 500 MG 24 hr tablet Take 1 tablet (500 mg total) by mouth 2 (two) times daily. 10/12/18   Anson Fret, MD    Allergies    Gluten meal and Penicillins  Review of Systems   Review of Systems  Constitutional:  Negative for activity change.  Respiratory:  Negative for shortness of breath.   Cardiovascular:  Negative for chest pain.  Gastrointestinal:  Negative for abdominal pain, diarrhea, nausea, rectal pain and vomiting.  Musculoskeletal:  Positive for arthralgias, joint swelling and myalgias. Negative for back pain, gait problem and neck stiffness.  Skin:  Negative for color change, rash and wound.  Neurological:  Negative for syncope, weakness, light-headedness and numbness.   Physical Exam Updated Vital Signs BP (!) 163/81   Pulse 64   Temp 98.6 F (37 C) (Oral)   Resp 17   LMP 05/16/2017 (Approximate)   SpO2 98%   Physical Exam Vitals and nursing note reviewed.  Constitutional:       General: She is not in acute distress.    Appearance: She is obese. She is not ill-appearing or diaphoretic.  HENT:     Head: Normocephalic.  Eyes:     Conjunctiva/sclera: Conjunctivae normal.  Cardiovascular:     Rate and Rhythm: Normal rate and regular rhythm.     Heart sounds: No murmur heard.   No friction rub. No gallop.  Pulmonary:     Effort: Pulmonary effort is normal. No respiratory distress.     Breath sounds: No stridor. No wheezing, rhonchi or rales.  Chest:     Chest wall: No tenderness.  Abdominal:     General: There is no distension.     Palpations: Abdomen is soft.  Musculoskeletal:        General: No tenderness or signs of injury.     Cervical back: Neck supple.     Right lower leg: No edema.     Left lower leg: No edema.     Comments: Minimal reproducible tenderness to the inferolateral left knee.  No medial or lateral joint line tenderness.  Pain is significantly increased with flexion and extension.  No appreciable swelling.  No erythema, warmth.  Normal exam of the left ankle and hip.  DP and PT pulses are 2+ and symmetric.  Good capillary refill.  Sensation is intact and equal throughout the bilateral lower extremities.  Skin:    General: Skin is warm.     Findings: No rash.  Neurological:     Mental Status: She is alert.  Psychiatric:        Behavior: Behavior normal.    ED Results / Procedures / Treatments   Labs (all labs ordered are listed, but only abnormal results are displayed) Labs Reviewed - No data to display  EKG None  Radiology DG Knee Complete 4 Views Left  Result Date: 12/03/2020 CLINICAL DATA:  Left knee pain, swelling EXAM: LEFT KNEE - COMPLETE 4+ VIEW COMPARISON:  None. FINDINGS: Joint space narrowing and spurring in the medial and patellofemoral compartments. No acute bony abnormality. Specifically, no fracture, subluxation, or dislocation. No joint effusion. IMPRESSION: Mild degenerative changes.  No acute bony abnormality.  Electronically Signed   By: Charlett Nose M.D.   On: 12/03/2020 23:29    Procedures Procedures   Medications Ordered in ED Medications  acetaminophen (TYLENOL) tablet 650 mg (650 mg Oral Given 12/03/20  2232)  ketorolac (TORADOL) injection 60 mg (60 mg Intramuscular Given 12/04/20 0500)  methocarbamol (ROBAXIN) tablet 500 mg (500 mg Oral Given 12/04/20 0500)    ED Course  I have reviewed the triage vital signs and the nursing notes.  Pertinent labs & imaging results that were available during my care of the patient were reviewed by me and considered in my medical decision making (see chart for details).    MDM Rules/Calculators/A&P                          50 year old female with a history of seizures, HTN, anemia who presents the emergency department with atraumatic left knee pain that began over the last few days.  Pain is worse with range of motion and weightbearing.  She is neurovascular intact.  She endorses swelling, but there is no appreciable swelling on my exam.  No constitutional symptoms.  Vital signs are stable in the ED.  Imaging has been reviewed and independently interpreted by me.  Mild degenerative changes of osteoarthritis with joint space narrowing and spurring in the medial and patellofemoral compartments.  No subluxation, fracture, or dislocation.  No joint effusions.  Discussed x-ray findings with the patient.  Suspect osteoarthritis flare.  Could also consider bursitis.  Doubt gout or septic joint.  She was given Toradol and Robaxin.  Will discharge with Robaxin and OTC analgesia.  She was given a brace and crutches to use until she can weight-bear without difficulty as needed.  She can follow-up with PCP or with orthopedics.  All questions answered.  ER return precautions given.  She is hemodynamically stable in no acute distress.  Safe for discharge home with outpatient follow-up as discussed.  Final Clinical Impression(s) / ED Diagnoses Final diagnoses:  Osteoarthritis  of left knee, unspecified osteoarthritis type    Rx / DC Orders ED Discharge Orders          Ordered    methocarbamol (ROBAXIN) 500 MG tablet  2 times daily        12/04/20 0455    predniSONE (STERAPRED UNI-PAK 21 TAB) 10 MG (21) TBPK tablet  Daily        12/04/20 0455             Barkley Boards, PA-C 12/04/20 0513    Palumbo, April, MD 12/04/20 2130

## 2020-12-04 NOTE — Discharge Instructions (Addendum)
Thank you for allowing me to care for you today in the Emergency Department.   Your x-ray was consistent with osteoarthritis.  This is wear-and-tear of the knee over time, but sometimes she can have a flareup of pain from overuse.  To manage her symptoms at home:  Wear the sleeve that you were given in the emergency department.  You can use the crutches as needed until you can put weight on your left leg without considerable pain.  Take 650 mg of Tylenol or 600 mg of ibuprofen with food every 6 hours for pain.  You can alternate between these 2 medications every 3 hours if your pain returns.  For instance, you can take Tylenol at noon, followed by a dose of ibuprofen at 3, followed by second dose of Tylenol and 6.  You can take 1 tablet of Robaxin up to 2 times daily.  This is a muscle relaxer.  It may make you drowsy.  Do not drive or work with this medication until you know how it impacts you.  Take prednisone as prescribed to help with pain and inflammation.  Return to the emergency department if you start any fevers, redness and warmth to the knee, if you become unable to walk, if you develop new numbness or weakness, if you have any fall or injury, or have other new, concerning symptoms.

## 2022-06-30 ENCOUNTER — Other Ambulatory Visit: Payer: Self-pay

## 2022-06-30 ENCOUNTER — Emergency Department (HOSPITAL_COMMUNITY)
Admission: EM | Admit: 2022-06-30 | Discharge: 2022-06-30 | Disposition: A | Discharge status: Home / Self Care | Payer: Self-pay | Attending: Emergency Medicine | Admitting: Emergency Medicine

## 2022-06-30 DIAGNOSIS — R569 Unspecified convulsions: Secondary | ICD-10-CM | POA: Insufficient documentation

## 2022-06-30 DIAGNOSIS — I10 Essential (primary) hypertension: Secondary | ICD-10-CM | POA: Insufficient documentation

## 2022-06-30 LAB — URINALYSIS, ROUTINE W REFLEX MICROSCOPIC
Bacteria, UA: NONE SEEN
Bilirubin Urine: NEGATIVE
Glucose, UA: NEGATIVE mg/dL
Hgb urine dipstick: NEGATIVE
Ketones, ur: NEGATIVE mg/dL
Leukocytes,Ua: NEGATIVE
Nitrite: NEGATIVE
Protein, ur: 100 mg/dL — AB
Specific Gravity, Urine: 1.021 (ref 1.005–1.030)
pH: 6 (ref 5.0–8.0)

## 2022-06-30 LAB — COMPREHENSIVE METABOLIC PANEL
ALT: 19 U/L (ref 0–44)
AST: 17 U/L (ref 15–41)
Albumin: 3.8 g/dL (ref 3.5–5.0)
Alkaline Phosphatase: 66 U/L (ref 38–126)
Anion gap: 7 (ref 5–15)
BUN: 12 mg/dL (ref 6–20)
CO2: 30 mmol/L (ref 22–32)
Calcium: 9 mg/dL (ref 8.9–10.3)
Chloride: 103 mmol/L (ref 98–111)
Creatinine, Ser: 0.69 mg/dL (ref 0.44–1.00)
GFR, Estimated: >60 mL/min (ref >60)
Glucose, Bld: 112 mg/dL — ABNORMAL HIGH (ref 70–99)
Potassium: 4 mmol/L (ref 3.5–5.1)
Sodium: 140 mmol/L (ref 135–145)
Total Bilirubin: 1 mg/dL (ref 0.3–1.2)
Total Protein: 7.2 g/dL (ref 6.5–8.1)

## 2022-06-30 LAB — CBC WITH DIFFERENTIAL/PLATELET
Abs Immature Granulocytes: 0.02 10*3/uL (ref 0.00–0.07)
Basophils Absolute: 0 10*3/uL (ref 0.0–0.1)
Basophils Relative: 0 %
Eosinophils Absolute: 0 10*3/uL (ref 0.0–0.5)
Eosinophils Relative: 0 %
HCT: 43.1 % (ref 36.0–46.0)
Hemoglobin: 14.3 g/dL (ref 12.0–15.0)
Immature Granulocytes: 0 %
Lymphocytes Relative: 43 %
Lymphs Abs: 2.4 10*3/uL (ref 0.7–4.0)
MCH: 28.2 pg (ref 26.0–34.0)
MCHC: 33.2 g/dL (ref 30.0–36.0)
MCV: 85 fL (ref 80.0–100.0)
Monocytes Absolute: 0.5 10*3/uL (ref 0.1–1.0)
Monocytes Relative: 9 %
Neutro Abs: 2.6 10*3/uL (ref 1.7–7.7)
Neutrophils Relative %: 48 %
Platelets: 220 10*3/uL (ref 150–400)
RBC: 5.07 MIL/uL (ref 3.87–5.11)
RDW: 14.6 % (ref 11.5–15.5)
WBC: 5.6 10*3/uL (ref 4.0–10.5)
nRBC: 0 % (ref 0.0–0.2)

## 2022-06-30 LAB — CBG MONITORING, ED: Glucose-Capillary: 108 mg/dL — ABNORMAL HIGH (ref 70–99)

## 2022-06-30 MED ORDER — DIVALPROEX SODIUM ER 500 MG PO TB24: 500.0000 mg | ORAL_TABLET | Freq: Two times a day (BID) | ORAL | 0 refills | Status: DC

## 2022-06-30 NOTE — ED Provider Triage Note (Signed)
Emergency Medicine Provider Triage Evaluation Note  Alison Collins , a 52 y.o. female  was evaluated in triage.  Pt complains of seizure. Pt with hx of seizure disorder not on any meds.  Has had recurrent seizure like episodes for months, last episode was yesterday.  Denies alcohol or tobacco use.  Denies any trauma.    Review of Systems  Positive: As above Negative: As above  Physical Exam  BP (!) 162/92   Pulse 68   Temp 98.1 F (36.7 C)   Resp 17   Ht 5\' 1"  (1.549 m)   LMP 05/16/2017 (Approximate)   SpO2 97%   BMI 41.95 kg/m  Gen:   Awake, no distress   Resp:  Normal effort  MSK:   Moves extremities without difficulty  Other:    Medical Decision Making  Medically screening exam initiated at 11:13 AM.  Appropriate orders placed.  Nyoka Alcoser was informed that the remainder of the evaluation will be completed by another provider, this initial triage assessment does not replace that evaluation, and the importance of remaining in the ED until their evaluation is complete.     Domenic Moras, PA-C 06/30/22 1121

## 2022-06-30 NOTE — ED Triage Notes (Signed)
Pt/husband stated, she has had seizures for the last 6-7 years. She was seeing a neurologist and stopped 2 years ago cause it was effecting her kidneys. She has not had any medication at all for seizures in 2 years. Pt had a seizure yesterday. Sometimes when she comes out of it she can be angry but not to the pont of hitting. She needs another neurologist.

## 2022-06-30 NOTE — Discharge Instructions (Addendum)
Per [facility] statutes, patients with seizures are not allowed to drive until they have been seizure-free for six months.  Other recommendations include using caution when using heavy equipment or power tools. Avoid working on ladders or at heights. Take showers instead of baths.  Do not swim alone.  Ensure the water temperature is not too high on the home water heater. Do not go swimming alone. Do not lock yourself in a room alone (i.e. bathroom). When caring for infants or small children, sit down when holding, feeding, or changing them to minimize risk of injury to the child in the event you have a seizure. Maintain good sleep hygiene. Avoid alcohol.  Also recommend adequate sleep, hydration, good diet and minimize stress.     During the Seizure   - First, ensure adequate ventilation and place patients on the floor on their left side  Loosen clothing around the neck and ensure the airway is patent. If the patient is clenching the teeth, do not force the mouth open with any object as this can cause severe damage - Remove all items from the surrounding that can be hazardous. The patient may be oblivious to what's happening and may not even know what he or she is doing. If the patient is confused and wandering, either gently guide him/her away and block access to outside areas - Reassure the individual and be comforting - Call 911. In most cases, the seizure ends before EMS arrives. However, there are cases when seizures may last over 3 to 5 minutes. Or the individual may have developed breathing difficulties or severe injuries. If a pregnant patient or a person with diabetes develops a seizure, it is prudent to call an ambulance. - Finally, if the patient does not regain full consciousness, then call EMS. Most patients will remain confused for about 45 to 90 minutes after a seizure, so you must use judgment in calling for help. - Avoid restraints but make sure the patient is in a bed with  padded side rails - Place the individual in a lateral position with the neck slightly flexed; this will help the saliva drain from the mouth and prevent the tongue from falling backward - Remove all nearby furniture and other hazards from the area - Provide verbal assurance as the individual is regaining consciousness - Provide the patient with privacy if possible - Call for help and start treatment as ordered by the caregiver   After the Seizure (Postictal Stage)   After a seizure, most patients experience confusion, fatigue, muscle pain and/or a headache. Thus, one should permit the individual to sleep. For the next few days, reassurance is essential. Being calm and helping reorient the person is also of importance.   Most seizures are painless and end spontaneously. Seizures are not harmful to others but can lead to complications such as stress on the lungs, brain and the heart. Individuals with prior lung problems may develop labored breathing and respiratory distress.    It was a pleasure caring for you today in the emergency department.   Please pick up medications I have sent in for you.   Please return to the emergency department for any worsening or worrisome symptoms.   I have sent an ambulatory referral to neurology.  I have sent this to [facility] on [address].  They will call you in the next 2 days to establish an appointment.  If they have not called after 2 days, please call them.

## 2022-06-30 NOTE — ED Provider Notes (Signed)
[place] EMERGENCY DEPARTMENT AT [facility] Provider Note   CSN: [id number] Arrival date & time: 06/30/22  1015     History  Chief Complaint  Patient presents with   Seizures    Linden Mikes is a 52 y.o. female with medical history of anemia, hypertension, migraines and seizures.  Patient presents to the ED for evaluation of seizures.  Patient presents with husband.  Patient reports that for the last 6 years she has had seizures.  The patient reports that she last saw her neurologist an unknown amount of time ago.  Patient reports that she stopped going for an unknown reason.  The patient states that she is not compliant on her seizure medication, Depakote.  Patient states that yesterday she had a seizure that lasted for about 2 minutes, this is confirmed by her husband at the bedside.  Patient has been reports that seizure lasted 2 minutes with tonic-clonic jerking activity and then 5 minutes of postictal activity.  Patient does endorse urination on herself during this event, denies biting tongue.  Patient states that at some point she saw a neurologist on [address] however cannot tell me the name of this neurologist.  The husband and the patient are requesting referral to neurology.  Patient denies any fevers, nausea or vomiting, one-sided weakness or numbness.  Patient denies chest pain or shortness of breath.   Seizures      Home Medications Prior to Admission medications   Medication Sig Start Date End Date Taking? Authorizing Provider  divalproex (DEPAKOTE ER) 500 MG 24 hr tablet Take 1 tablet (500 mg total) by mouth 2 (two) times daily. 06/30/22  Yes Al Decant, PA-C  methocarbamol (ROBAXIN) 500 MG tablet Take 1 tablet (500 mg total) by mouth 2 (two) times daily. 12/04/20   McDonald, Mia A, PA-C  predniSONE (STERAPRED UNI-PAK 21 TAB) 10 MG (21) TBPK tablet Take by mouth daily. Take 6 tabs by mouth daily  for 2 days, then 5 tabs for 2 days, then 4 tabs for 2  days, then 3 tabs for 2 days, 2 tabs for 2 days, then 1 tab by mouth daily for 2 days 12/04/20   McDonald, Mia A, PA-C      Allergies    Gluten meal and Penicillins    Review of Systems   Review of Systems  Constitutional:  Negative for fever.  Respiratory:  Negative for shortness of breath.   Cardiovascular:  Negative for chest pain.  Gastrointestinal:  Negative for nausea and vomiting.  Neurological:  Positive for seizures.  All other systems reviewed and are negative.   Physical Exam Updated Vital Signs BP (!) 162/92   Pulse 68   Temp 98.1 F (36.7 C)   Resp 17   Ht 5\' 1"  (1.549 m)   LMP 05/16/2017 (Approximate)   SpO2 97%   BMI 41.95 kg/m  Physical Exam Vitals and nursing note reviewed.  Constitutional:      General: She is not in acute distress.    Appearance: Normal appearance. She is not ill-appearing, toxic-appearing or diaphoretic.  HENT:     Head: Normocephalic and atraumatic.     Nose: Nose normal. No congestion.     Mouth/Throat:     Mouth: Mucous membranes are moist.     Pharynx: Oropharynx is clear.  Eyes:     Extraocular Movements: Extraocular movements intact.     Conjunctiva/sclera: Conjunctivae normal.     Pupils: Pupils are equal, round, and reactive to  light.  Cardiovascular:     Rate and Rhythm: Normal rate and regular rhythm.  Pulmonary:     Effort: Pulmonary effort is normal.     Breath sounds: Normal breath sounds. No wheezing.  Abdominal:     General: Abdomen is flat. Bowel sounds are normal.     Palpations: Abdomen is soft.     Tenderness: There is no abdominal tenderness.  Musculoskeletal:     Cervical back: Normal range of motion and neck supple. No tenderness.  Skin:    General: Skin is warm and dry.     Capillary Refill: Capillary refill takes less than 2 seconds.  Neurological:     General: No focal deficit present.     Mental Status: She is alert and oriented to person, place, and time.     GCS: GCS eye subscore is 4. GCS  verbal subscore is 5. GCS motor subscore is 6.     Cranial Nerves: Cranial nerves 2-12 are intact. No cranial nerve deficit.     Sensory: Sensation is intact. No sensory deficit.     Motor: Motor function is intact. No weakness.     Coordination: Coordination is intact. Heel to [facility] Test normal.     Comments: 5 out of 5 strength upper extremities bilaterally.  5 out of 5 strength bilateral lower extremities.  No facial droop.  No slurred speech.  Alert and oriented x 4.  Cranial nerves II through XII intact.  Intact finger-nose, heel-to-shin.     ED Results / Procedures / Treatments   Labs (all labs ordered are listed, but only abnormal results are displayed) Labs Reviewed  COMPREHENSIVE METABOLIC PANEL - Abnormal; Notable for the following components:      Result Value   Glucose, Bld 112 (*)    All other components within normal limits  URINALYSIS, ROUTINE W REFLEX MICROSCOPIC - Abnormal; Notable for the following components:   APPearance HAZY (*)    Protein, ur 100 (*)    All other components within normal limits  CBG MONITORING, ED - Abnormal; Notable for the following components:   Glucose-Capillary 108 (*)    All other components within normal limits  CBC WITH DIFFERENTIAL/PLATELET    EKG None  Radiology No results found.  Procedures Procedures    Medications Ordered in ED Medications - No data to display  ED Course/ Medical Decision Making/ A&P  Medical Decision Making Risk Prescription drug management.   52 year old female with known seizure disorder presents to ED for evaluation of seizure that occurred yesterday.  Please see HPI for further details.  On examination the patient is alert and oriented x 4.  Patient neurological examination shows no focal neurodeficits.  Patient afebrile and nontachycardic.  Patient lung sounds clear bilaterally, she is not hypoxic on room air.  Patient abdomen soft and compressible throughout.  CBC unremarkable.  CMP  unremarkable.  Urinalysis unremarkable.  CBG 108.  Patient reports that she has had no seizure-like activity for the last 24 hours.  Patient reports she has not taken her antiseizure medication in quite some time.  The patient is a very poor historian, unable to provide much detail as to when she was seen for seizures, who saw her, what she took or when she stopped going to the neurologist.  The patient will be referred back to neurology via ambulatory referral.  Patient will be placed back on her Depakote.  On chart review, the patient has recently taken Depakote 5 mg extended release twice  daily.  The patient will be placed back on this medication.  At this time, patient stable for discharge.  Patient advised to return to the ED with any new or worsening signs or symptoms.  Patient had all her questions answered to her satisfaction.  The patient is stable at this time for discharge home.   Final Clinical Impression(s) / ED Diagnoses Final diagnoses:  Seizure ([place])    Rx / DC Orders ED Discharge Orders          Ordered    Ambulatory referral to Neurology       Comments: An appointment is requested in approximately: 1 week   06/30/22 1604    divalproex (DEPAKOTE ER) 500 MG 24 hr tablet  2 times daily        06/30/22 1606              Azucena Cecil, [place] 06/30/22 1627    Wynona Dove A, DO 07/01/22 1245

## 2022-07-05 ENCOUNTER — Telehealth: Payer: Self-pay | Admitting: *Deleted

## 2022-07-05 NOTE — Telephone Encounter (Signed)
Significant other called regarding referral to Neurology.  RNCM reviewed chart to find that referral was sent and to call office for further information regarding appointment time and date.

## 2022-12-13 ENCOUNTER — Emergency Department (HOSPITAL_COMMUNITY): Payer: Self-pay

## 2022-12-13 ENCOUNTER — Emergency Department (HOSPITAL_COMMUNITY)
Admission: EM | Admit: 2022-12-13 | Discharge: 2022-12-14 | Discharge status: Against Medical Advice | Payer: Self-pay | Attending: Emergency Medicine | Admitting: Emergency Medicine

## 2022-12-13 ENCOUNTER — Encounter (HOSPITAL_COMMUNITY): Payer: Self-pay

## 2022-12-13 ENCOUNTER — Other Ambulatory Visit: Payer: Self-pay

## 2022-12-13 DIAGNOSIS — R569 Unspecified convulsions: Secondary | ICD-10-CM | POA: Insufficient documentation

## 2022-12-13 DIAGNOSIS — Z5321 Procedure and treatment not carried out due to patient leaving prior to being seen by health care provider: Secondary | ICD-10-CM | POA: Insufficient documentation

## 2022-12-13 LAB — URINALYSIS, ROUTINE W REFLEX MICROSCOPIC
Glucose, UA: NEGATIVE mg/dL
Hgb urine dipstick: NEGATIVE
Ketones, ur: 5 mg/dL — AB
Leukocytes,Ua: NEGATIVE
Nitrite: NEGATIVE
Protein, ur: 100 mg/dL — AB
Specific Gravity, Urine: 1.026 (ref 1.005–1.030)
pH: 5 (ref 5.0–8.0)

## 2022-12-13 LAB — BASIC METABOLIC PANEL
Anion gap: 10 (ref 5–15)
BUN: 14 mg/dL (ref 6–20)
CO2: 24 mmol/L (ref 22–32)
Calcium: 9.5 mg/dL (ref 8.9–10.3)
Chloride: 104 mmol/L (ref 98–111)
Creatinine, Ser: 0.87 mg/dL (ref 0.44–1.00)
GFR, Estimated: >60 mL/min (ref >60)
Glucose, Bld: 131 mg/dL — ABNORMAL HIGH (ref 70–99)
Potassium: 3.7 mmol/L (ref 3.5–5.1)
Sodium: 138 mmol/L (ref 135–145)

## 2022-12-13 LAB — CBC
HCT: 42.5 % (ref 36.0–46.0)
Hemoglobin: 13.9 g/dL (ref 12.0–15.0)
MCH: 28 pg (ref 26.0–34.0)
MCHC: 32.7 g/dL (ref 30.0–36.0)
MCV: 85.5 fL (ref 80.0–100.0)
Platelets: 255 10*3/uL (ref 150–400)
RBC: 4.97 MIL/uL (ref 3.87–5.11)
RDW: 14.7 % (ref 11.5–15.5)
WBC: 7.1 10*3/uL (ref 4.0–10.5)
nRBC: 0 % (ref 0.0–0.2)

## 2022-12-13 LAB — CBG MONITORING, ED: Glucose-Capillary: 133 mg/dL — ABNORMAL HIGH (ref 70–99)

## 2022-12-13 MED ORDER — LEVETIRACETAM 750 MG PO TABS
1500.0000 mg | ORAL_TABLET | Freq: Once | ORAL | Status: AC
  Administered 2022-12-13: 1500 mg via ORAL
  Filled 2022-12-13: qty 2

## 2022-12-13 NOTE — ED Notes (Signed)
PA at bedside. Pt states she has voices when is she depressed to kill herself. Pt denies suicide thoughts at this time.

## 2022-12-13 NOTE — ED Provider Triage Note (Signed)
Emergency Medicine Provider Triage Evaluation Note  Alison Collins , a 52 y.o. female  was evaluated in triage.  Pt complains of seizure this AM. Hx of seizures, has not taken meds for some time as she has been out of it. Seizure happened at 530 AM this morning, witnessed by husband. Was originally Post-ictal per husband.  Review of Systems  Positive: seizure Negative: Chest pain, SOB  Physical Exam  BP (!) 148/104 (BP Location: Right Arm)   Pulse 93   Temp 97.7 F (36.5 C) (Oral)   Resp 18   Ht 5\' 3"  (1.6 m)   Wt 72.6 kg   LMP 05/16/2017 (Approximate)   SpO2 99%   BMI 28.34 kg/m  Gen:   Awake, no distress   Resp:  Normal effort  MSK:   Moves extremities without difficulty  Other:  No neurodeficits  Medical Decision Making  Medically screening exam initiated at 5:15 PM.  Appropriate orders placed.  Jady Chhim was informed that the remainder of the evaluation will be completed by another provider, this initial triage assessment does not replace that evaluation, and the importance of remaining in the ED until their evaluation is complete.     Pete Pelt, [place] 12/13/22 [phone]

## 2022-12-13 NOTE — ED Triage Notes (Signed)
PT came to ED for syncopal episode that happened today. Pt states it has happened before. Pt states she had LOC, Husband states she had a seizure. Pt has hx of seizures and ran out of medicine.  Axox4. No physical trauma at this time. Pt ambulatory in room.

## 2022-12-14 ENCOUNTER — Other Ambulatory Visit: Payer: Self-pay

## 2022-12-14 ENCOUNTER — Emergency Department (HOSPITAL_COMMUNITY)
Admission: EM | Admit: 2022-12-14 | Discharge: 2022-12-15 | Disposition: A | Discharge status: Home / Self Care | Payer: Self-pay | Attending: Emergency Medicine | Admitting: Emergency Medicine

## 2022-12-14 ENCOUNTER — Encounter (HOSPITAL_COMMUNITY): Payer: Self-pay

## 2022-12-14 DIAGNOSIS — G40909 Epilepsy, unspecified, not intractable, without status epilepticus: Secondary | ICD-10-CM | POA: Insufficient documentation

## 2022-12-14 LAB — URINALYSIS, ROUTINE W REFLEX MICROSCOPIC
Glucose, UA: NEGATIVE mg/dL
Hgb urine dipstick: NEGATIVE
Ketones, ur: 5 mg/dL — AB
Leukocytes,Ua: NEGATIVE
Nitrite: NEGATIVE
Protein, ur: 30 mg/dL — AB
Specific Gravity, Urine: 1.029 (ref 1.005–1.030)
pH: 5 (ref 5.0–8.0)

## 2022-12-14 LAB — BASIC METABOLIC PANEL
Anion gap: 8 (ref 5–15)
BUN: 22 mg/dL — ABNORMAL HIGH (ref 6–20)
CO2: 24 mmol/L (ref 22–32)
Calcium: 8.9 mg/dL (ref 8.9–10.3)
Chloride: 104 mmol/L (ref 98–111)
Creatinine, Ser: 0.85 mg/dL (ref 0.44–1.00)
GFR, Estimated: >60 mL/min (ref >60)
Glucose, Bld: 117 mg/dL — ABNORMAL HIGH (ref 70–99)
Potassium: 3.7 mmol/L (ref 3.5–5.1)
Sodium: 136 mmol/L (ref 135–145)

## 2022-12-14 LAB — CBC
HCT: 40.8 % (ref 36.0–46.0)
Hemoglobin: 13.2 g/dL (ref 12.0–15.0)
MCH: 28.1 pg (ref 26.0–34.0)
MCHC: 32.4 g/dL (ref 30.0–36.0)
MCV: 86.8 fL (ref 80.0–100.0)
Platelets: 239 10*3/uL (ref 150–400)
RBC: 4.7 MIL/uL (ref 3.87–5.11)
RDW: 14.7 % (ref 11.5–15.5)
WBC: 7.6 10*3/uL (ref 4.0–10.5)
nRBC: 0 % (ref 0.0–0.2)

## 2022-12-14 NOTE — ED Notes (Signed)
Pt name called multiple times for updated vitals, no response.  

## 2022-12-14 NOTE — ED Triage Notes (Signed)
NOTE FROM YESTERDAY:   PT came to ED for syncopal episode that happened today. Pt states it has happened before. Pt states she had LOC, Husband states she had a seizure. Pt has hx of seizures and ran out of medicine.  Axox4. No physical trauma at this time. Pt ambulatory in room.   TODAY  Pt states she is coming in " to finish what she started yesterday with her care she was receiving " she states she had received medicine that was helpful yesterday and is coming back in for the same. No LOC today, no seizure like activity today, and no new complaints today.

## 2022-12-15 ENCOUNTER — Emergency Department (HOSPITAL_COMMUNITY): Payer: Self-pay

## 2022-12-15 LAB — CBC WITH DIFFERENTIAL/PLATELET
Abs Immature Granulocytes: 0.01 10*3/uL (ref 0.00–0.07)
Basophils Absolute: 0 10*3/uL (ref 0.0–0.1)
Basophils Relative: 0 %
Eosinophils Absolute: 0 10*3/uL (ref 0.0–0.5)
Eosinophils Relative: 1 %
HCT: 41.5 % (ref 36.0–46.0)
Hemoglobin: 13.4 g/dL (ref 12.0–15.0)
Immature Granulocytes: 0 %
Lymphocytes Relative: 43 %
Lymphs Abs: 2.9 10*3/uL (ref 0.7–4.0)
MCH: 27.6 pg (ref 26.0–34.0)
MCHC: 32.3 g/dL (ref 30.0–36.0)
MCV: 85.6 fL (ref 80.0–100.0)
Monocytes Absolute: 0.6 10*3/uL (ref 0.1–1.0)
Monocytes Relative: 8 %
Neutro Abs: 3.1 10*3/uL (ref 1.7–7.7)
Neutrophils Relative %: 48 %
Platelets: 232 10*3/uL (ref 150–400)
RBC: 4.85 MIL/uL (ref 3.87–5.11)
RDW: 14.8 % (ref 11.5–15.5)
WBC: 6.6 10*3/uL (ref 4.0–10.5)
nRBC: 0 % (ref 0.0–0.2)

## 2022-12-15 LAB — RAPID URINE DRUG SCREEN, HOSP PERFORMED
Amphetamines: NOT DETECTED
Barbiturates: NOT DETECTED
Benzodiazepines: NOT DETECTED
Cocaine: NOT DETECTED
Opiates: NOT DETECTED
Tetrahydrocannabinol: NOT DETECTED

## 2022-12-15 LAB — AMMONIA: Ammonia: 19 umol/L (ref 9–35)

## 2022-12-15 LAB — TROPONIN I (HIGH SENSITIVITY)
Troponin I (High Sensitivity): 5 ng/L (ref <18)
Troponin I (High Sensitivity): 5 ng/L (ref <18)

## 2022-12-15 LAB — VALPROIC ACID LEVEL: Valproic Acid Lvl: <10 ug/mL — ABNORMAL LOW (ref 50.0–100.0)

## 2022-12-15 LAB — CK: Total CK: 198 U/L (ref 38–234)

## 2022-12-15 MED ORDER — DIVALPROEX SODIUM ER 500 MG PO TB24: 500.0000 mg | ORAL_TABLET | Freq: Two times a day (BID) | ORAL | 0 refills | Status: DC

## 2022-12-15 MED ORDER — VALPROATE SODIUM 100 MG/ML IV SOLN
1500.0000 mg | Freq: Once | INTRAVENOUS | Status: AC
  Administered 2022-12-15: 1500 mg via INTRAVENOUS
  Filled 2022-12-15: qty 15

## 2022-12-15 NOTE — Discharge Instructions (Addendum)
Take your seizure medication as prescribed.  You should have an MRI of your brain as your CT scan is slightly abnormal.  The neurologist can schedule this for you.  Do not drive or operate heavy machinery until cleared by the neurologist.  Return to the ED with new or worsening symptoms.

## 2022-12-15 NOTE — ED Provider Notes (Signed)
[place] EMERGENCY DEPARTMENT AT [facility] Provider Note   CSN: [id number] Arrival date & time: 12/14/22  1732     History  Chief Complaint  Patient presents with   Loss of Consciousness    Alison Collins is a 52 y.o. female.  Patient with a history of seizure disorder no longer on medication presenting with seizure episode yesterday morning.  Husband states she had a "grand mal" seizure that lasted about 3 minutes.  Unknown whether she fell or hit her head.  Did have loss of consciousness.  No tongue biting or incontinence.  She is prescribed Depakote but has not had any for 3 months.  States he referred to neurology in the past but has never seen a neurologist.  (Chart review shows she was seen in 2020). She complains of a diffuse headache now.  Denies any chest pain, shortness of breath, nausea, vomiting, fever.  No focal weakness, numbness or tingling.  No chest pain or shortness of breath.  No abdominal pain.  No alcohol use or drug use.  Denies any prescription medications currently.  No fever or cough.  States she was seen here in January and referred to neurology but she never followed up with them.  Does not have a neurologist currently.  Not had her Depakote for at least 3 months.  Does not take anything for seizures currently.  The history is provided by the patient.  Loss of Consciousness Associated symptoms: headaches and seizures   Associated symptoms: no chest pain, no dizziness, no fever, no nausea, no shortness of breath, no vomiting and no weakness        Home Medications Prior to Admission medications   Medication Sig Start Date End Date Taking? Authorizing Provider  divalproex (DEPAKOTE ER) 500 MG 24 hr tablet Take 1 tablet (500 mg total) by mouth 2 (two) times daily. 06/30/22   Al Decant, PA-C  methocarbamol (ROBAXIN) 500 MG tablet Take 1 tablet (500 mg total) by mouth 2 (two) times daily. 12/04/20   McDonald, Mia A, PA-C  predniSONE  (STERAPRED UNI-PAK 21 TAB) 10 MG (21) TBPK tablet Take by mouth daily. Take 6 tabs by mouth daily  for 2 days, then 5 tabs for 2 days, then 4 tabs for 2 days, then 3 tabs for 2 days, 2 tabs for 2 days, then 1 tab by mouth daily for 2 days 12/04/20   McDonald, Mia A, PA-C      Allergies    Gluten meal and Penicillins    Review of Systems   Review of Systems  Constitutional:  Negative for activity change, appetite change and fever.  HENT:  Negative for congestion and rhinorrhea.   Respiratory:  Negative for cough, chest tightness and shortness of breath.   Cardiovascular:  Positive for syncope. Negative for chest pain.  Gastrointestinal:  Negative for abdominal pain, nausea and vomiting.  Genitourinary:  Negative for dysuria and hematuria.  Musculoskeletal:  Negative for arthralgias and neck pain.  Skin:  Negative for rash.  Neurological:  Positive for seizures and headaches. Negative for dizziness and weakness.   all other systems are negative except as noted in the HPI and PMH.    Physical Exam Updated Vital Signs BP (!) 167/117 (BP Location: Right Arm)   Pulse 66   Temp 97.9 F (36.6 C)   Resp 16   LMP 05/16/2017 (Approximate)   SpO2 98%  Physical Exam Vitals and nursing note reviewed.  Constitutional:  General: She is not in acute distress.    Appearance: She is well-developed.  HENT:     Head: Normocephalic and atraumatic.     Mouth/Throat:     Pharynx: No oropharyngeal exudate.  Eyes:     Conjunctiva/sclera: Conjunctivae normal.     Pupils: Pupils are equal, round, and reactive to light.  Neck:     Comments: No meningismus. Cardiovascular:     Rate and Rhythm: Normal rate and regular rhythm.     Heart sounds: Normal heart sounds. No murmur heard. Pulmonary:     Effort: Pulmonary effort is normal. No respiratory distress.     Breath sounds: Normal breath sounds.  Abdominal:     Palpations: Abdomen is soft.     Tenderness: There is no abdominal tenderness.  There is no guarding or rebound.  Musculoskeletal:        General: No tenderness. Normal range of motion.     Cervical back: Normal range of motion and neck supple.  Skin:    General: Skin is warm.  Neurological:     Mental Status: She is alert.     Cranial Nerves: No cranial nerve deficit.     Motor: No abnormal muscle tone.     Coordination: Coordination normal.     Comments: CN 2-12 intact, no ataxia on finger to nose, no nystagmus, 5/5 strength throughout, no pronator drift,   Oriented to person and place. Disoriented to time.    Psychiatric:        Behavior: Behavior normal.     ED Results / Procedures / Treatments   Labs (all labs ordered are listed, but only abnormal results are displayed) Labs Reviewed  BASIC METABOLIC PANEL - Abnormal; Notable for the following components:      Result Value   Glucose, Bld 117 (*)    BUN 22 (*)    All other components within normal limits  URINALYSIS, ROUTINE W REFLEX MICROSCOPIC - Abnormal; Notable for the following components:   Color, Urine AMBER (*)    APPearance HAZY (*)    Bilirubin Urine SMALL (*)    Ketones, ur 5 (*)    Protein, ur 30 (*)    Bacteria, UA RARE (*)    All other components within normal limits  VALPROIC ACID LEVEL - Abnormal; Notable for the following components:   Valproic Acid Lvl <10 (*)    All other components within normal limits  CBC  AMMONIA  CBC WITH DIFFERENTIAL/PLATELET  RAPID URINE DRUG SCREEN, HOSP PERFORMED  CK  CBG MONITORING, ED  TROPONIN I (HIGH SENSITIVITY)  TROPONIN I (HIGH SENSITIVITY)    EKG EKG Interpretation Date/Time:  Wednesday December 15 2022 04:06:19 EDT Ventricular Rate:  67 PR Interval:  186 QRS Duration:  82 QT Interval:  366 QTC Calculation: 386 R Axis:   22  Text Interpretation: Normal sinus rhythm Low voltage QRS Borderline ECG When compared with ECG of 13-Dec-2022 16:47, PREVIOUS ECG IS PRESENT No significant change was found Confirmed by Glynn Octave [id number])  on 12/15/2022 4:49:58 AM  Radiology CT Head Wo Contrast  Result Date: 12/15/2022 CLINICAL DATA:  Headache, neuro deficit EXAM: CT HEAD WITHOUT CONTRAST TECHNIQUE: Contiguous axial images were obtained from the base of the skull through the vertex without intravenous contrast. RADIATION DOSE REDUCTION: This exam was performed according to the departmental dose-optimization program which includes automated exposure control, adjustment of the mA and/or kV according to patient size and/or use of iterative reconstruction technique. COMPARISON:  05/23/2018 FINDINGS:  Brain: Prominent lateral and 3rd ventricles, slightly progressed since prior study. Fourth ventricle normal. Low-density throughout the deep white matter compatible with chronic small vessel disease. No hemorrhage or acute infarction. Vascular: No hyperdense vessel or unexpected calcification. Skull: No acute calvarial abnormality. Sinuses/Orbits: No acute findings Other: None IMPRESSION: Progressive mild dilatation of the lateral ventricles and 3rd ventricle since prior study. This could be further evaluated with MRI if felt clinically indicated. Low-density throughout the deep white matter, likely chronic small vessel disease. Electronically Signed   By: Charlett Nose M.D.   On: 12/15/2022 01:04   DG Chest 2 View  Result Date: 12/13/2022 CLINICAL DATA:  Seizure. EXAM: CHEST - 2 VIEW COMPARISON:  May 12, 2017. FINDINGS: The heart size and mediastinal contours are within normal limits. Both lungs are clear. The visualized skeletal structures are unremarkable. IMPRESSION: No active cardiopulmonary disease. Electronically Signed   By: Lupita Raider M.D.   On: 12/13/2022 17:56    Procedures Procedures    Medications Ordered in ED Medications - No data to display  ED Course/ Medical Decision Making/ A&P                             Medical Decision Making Amount and/or Complexity of Data Reviewed Labs: ordered. Decision-making details  documented in ED Course. Radiology: ordered and independent interpretation performed. Decision-making details documented in ED Course. ECG/medicine tests: ordered and independent interpretation performed. Decision-making details documented in ED Course.  Risk Prescription drug management.   Episode of LOC yesterday with witnessed seizure activity.  Has not had her seizure medication for several months.  Does not follow with neurology.  Her neurological exam is nonfocal though she was disoriented to time.  Discussed with Dr. Jerrell Belfast of neurology.  He agrees with loading with Depakote 20 mg/kg and restarting 500 mg twice daily.  CT head shows dilation of lateral ventricles mildly progressed from previous.  Results reviewed and interpreted by me.  No hemorrhage.  Patient's neurological exam is nonfocal.  No seizure activity throughout ED course.  Electrolytes are reassuring.  No evidence of infection or fever.  Depakote level undetectable.  Will restart her home Depakote. Follow-up with neurology.  Avoid driving or operating heavy machinery. MRI offered today but patient prefers to go home with outpatient follow-up for neurology. This seems reasonable.  Return precautions discussed.         Final Clinical Impression(s) / ED Diagnoses Final diagnoses:  Seizure disorder [facility])    Rx / DC Orders ED Discharge Orders     None         Wildon Cuevas, Jeannett Senior, MD 12/15/22 [phone]

## 2023-01-23 ENCOUNTER — Emergency Department (HOSPITAL_COMMUNITY)
Admission: EM | Admit: 2023-01-23 | Discharge: 2023-01-24 | Disposition: A | Discharge status: Home / Self Care | Payer: Self-pay | Attending: Emergency Medicine | Admitting: Emergency Medicine

## 2023-01-23 ENCOUNTER — Encounter (HOSPITAL_COMMUNITY): Payer: Self-pay

## 2023-01-23 ENCOUNTER — Other Ambulatory Visit: Payer: Self-pay

## 2023-01-23 DIAGNOSIS — Z760 Encounter for issue of repeat prescription: Secondary | ICD-10-CM | POA: Insufficient documentation

## 2023-01-23 NOTE — ED Triage Notes (Signed)
Pt came in via POV d/t being out her Depakote & is out & needs a refill since her next refill is not available with her PCP appointment in September. She also reports accidentally misread the dose & was taking too much of it is why she ran out & now feels very sleepy. A/OX4, denies pain.

## 2023-01-24 LAB — CBC WITH DIFFERENTIAL/PLATELET
Abs Immature Granulocytes: 0.01 10*3/uL (ref 0.00–0.07)
Basophils Absolute: 0 10*3/uL (ref 0.0–0.1)
Basophils Relative: 0 %
Eosinophils Absolute: 0 10*3/uL (ref 0.0–0.5)
Eosinophils Relative: 1 %
HCT: 42.3 % (ref 36.0–46.0)
Hemoglobin: 13.3 g/dL (ref 12.0–15.0)
Immature Granulocytes: 0 %
Lymphocytes Relative: 46 %
Lymphs Abs: 3.4 10*3/uL (ref 0.7–4.0)
MCH: 27 pg (ref 26.0–34.0)
MCHC: 31.4 g/dL (ref 30.0–36.0)
MCV: 85.8 fL (ref 80.0–100.0)
Monocytes Absolute: 0.7 10*3/uL (ref 0.1–1.0)
Monocytes Relative: 10 %
Neutro Abs: 3.1 10*3/uL (ref 1.7–7.7)
Neutrophils Relative %: 43 %
Platelets: 192 10*3/uL (ref 150–400)
RBC: 4.93 MIL/uL (ref 3.87–5.11)
RDW: 15.6 % — ABNORMAL HIGH (ref 11.5–15.5)
WBC: 7.3 10*3/uL (ref 4.0–10.5)
nRBC: 0 % (ref 0.0–0.2)

## 2023-01-24 LAB — COMPREHENSIVE METABOLIC PANEL
ALT: 25 U/L (ref 0–44)
AST: 21 U/L (ref 15–41)
Albumin: 3.6 g/dL (ref 3.5–5.0)
Alkaline Phosphatase: 67 U/L (ref 38–126)
Anion gap: 11 (ref 5–15)
BUN: 16 mg/dL (ref 6–20)
CO2: 24 mmol/L (ref 22–32)
Calcium: 8.8 mg/dL — ABNORMAL LOW (ref 8.9–10.3)
Chloride: 102 mmol/L (ref 98–111)
Creatinine, Ser: 0.98 mg/dL (ref 0.44–1.00)
GFR, Estimated: >60 mL/min (ref >60)
Glucose, Bld: 133 mg/dL — ABNORMAL HIGH (ref 70–99)
Potassium: 3.8 mmol/L (ref 3.5–5.1)
Sodium: 137 mmol/L (ref 135–145)
Total Bilirubin: 0.5 mg/dL (ref 0.3–1.2)
Total Protein: 6.9 g/dL (ref 6.5–8.1)

## 2023-01-24 LAB — VALPROIC ACID LEVEL: Valproic Acid Lvl: 49 ug/mL — ABNORMAL LOW (ref 50.0–100.0)

## 2023-01-24 MED ORDER — DIVALPROEX SODIUM ER 500 MG PO TB24: 500.0000 mg | ORAL_TABLET | Freq: Two times a day (BID) | ORAL | 0 refills | Status: DC

## 2023-01-24 NOTE — Discharge Instructions (Signed)
Take medications as prescribed-- only 1 tab twice a day. Follow-up with your doctor. Return here for new concerns.

## 2023-01-24 NOTE — ED Provider Notes (Signed)
[place] EMERGENCY DEPARTMENT AT [facility] Provider Note   CSN: [id number] Arrival date & time: 01/23/23  1545     History  Chief Complaint  Patient presents with   Request Med Refill    Alison Collins is a 52 y.o. female.  The history is provided by the patient and medical records.   52 year old female presenting to the ED with medication concerns.  She was just started on Depakote 12/15/2022 for seizures.  States she missed read the label and has been taking double her prescribed dosage (2 tabs BID instead of 1 tab BID).  Husband reports this has been making her very sleepy and drowsy.  She has not had any vomiting.  She has not been altered.  States she has now run out of medication is concerned she may have recurrent seizures.  Home Medications Prior to Admission medications   Medication Sig Start Date End Date Taking? Authorizing Provider  divalproex (DEPAKOTE ER) 500 MG 24 hr tablet Take 1 tablet (500 mg total) by mouth in the morning and at bedtime. 01/24/23  Yes Garlon Hatchet, PA-C  methocarbamol (ROBAXIN) 500 MG tablet Take 1 tablet (500 mg total) by mouth 2 (two) times daily. 12/04/20   McDonald, Mia A, PA-C  predniSONE (STERAPRED UNI-PAK 21 TAB) 10 MG (21) TBPK tablet Take by mouth daily. Take 6 tabs by mouth daily  for 2 days, then 5 tabs for 2 days, then 4 tabs for 2 days, then 3 tabs for 2 days, 2 tabs for 2 days, then 1 tab by mouth daily for 2 days 12/04/20   McDonald, Mia A, PA-C      Allergies    Gluten meal and Penicillins    Review of Systems   Review of Systems  Constitutional:        Medication needs  All other systems reviewed and are negative.   Physical Exam Updated Vital Signs BP (!) 182/120 (BP Location: Right Arm)   Pulse 61   Temp 98 F (36.7 C)   Resp 16   Ht 5\' 3"  (1.6 m)   Wt 90.7 kg   LMP 05/16/2017 (Approximate)   SpO2 100%   BMI 35.43 kg/m   Physical Exam Vitals and nursing note reviewed.  Constitutional:       Appearance: She is well-developed.     Comments: Eating apple, NAD  HENT:     Head: Normocephalic and atraumatic.  Eyes:     Conjunctiva/sclera: Conjunctivae normal.     Pupils: Pupils are equal, round, and reactive to light.  Cardiovascular:     Rate and Rhythm: Normal rate and regular rhythm.     Heart sounds: Normal heart sounds.  Pulmonary:     Effort: Pulmonary effort is normal.     Breath sounds: Normal breath sounds.  Abdominal:     General: Bowel sounds are normal.     Palpations: Abdomen is soft.  Musculoskeletal:        General: Normal range of motion.     Cervical back: Normal range of motion.  Skin:    General: Skin is warm and dry.  Neurological:     Mental Status: She is alert and oriented to person, place, and time.     Comments: Awake, alert, oriented x 3, answering questions and following commands appropriately, no focal deficits     ED Results / Procedures / Treatments   Labs (all labs ordered are listed, but only abnormal results are displayed) Labs Reviewed  CBC WITH DIFFERENTIAL/PLATELET - Abnormal; Notable for the following components:      Result Value   RDW 15.6 (*)    All other components within normal limits  VALPROIC ACID LEVEL - Abnormal; Notable for the following components:   Valproic Acid Lvl 49 (*)    All other components within normal limits  COMPREHENSIVE METABOLIC PANEL - Abnormal; Notable for the following components:   Glucose, Bld 133 (*)    Calcium 8.8 (*)    All other components within normal limits    EKG None  Radiology No results found.  Procedures Procedures    Medications Ordered in ED Medications - No data to display  ED Course/ Medical Decision Making/ A&P                                 Medical Decision Making Amount and/or Complexity of Data Reviewed Labs: ordered. ECG/medicine tests: ordered and independent interpretation performed.  Risk Prescription drug management.   52 year old female  presenting to the ED for refill of her Depakote.  She misread the label and has been taking double her prescribed dose.  She has had some periods of sleepiness but is currently awake, alert.  She is not altered.  She denies any nausea or vomiting.  Labs were obtained--Depakote level is not toxic, normal renal function.  No significant electrolyte derangement.  Medication was refilled, she was educated once again on proper dosing as recommended by neurology.  She can follow-up as an outpatient.  Return here for new concerns.  Final Clinical Impression(s) / ED Diagnoses Final diagnoses:  Medication refill    Rx / DC Orders ED Discharge Orders          Ordered    divalproex (DEPAKOTE ER) 500 MG 24 hr tablet  2 times daily        01/24/23 0233              Garlon Hatchet, PA-C 01/24/23 0236    Sabas Sous, MD 01/24/23 [phone]

## 2023-03-04 ENCOUNTER — Encounter (HOSPITAL_COMMUNITY): Payer: Self-pay | Admitting: *Deleted

## 2023-03-04 ENCOUNTER — Telehealth: Payer: Self-pay | Admitting: Neurology

## 2023-03-04 ENCOUNTER — Other Ambulatory Visit: Payer: Self-pay

## 2023-03-04 ENCOUNTER — Ambulatory Visit: Payer: Self-pay | Admitting: Neurology

## 2023-03-04 ENCOUNTER — Emergency Department (HOSPITAL_COMMUNITY)
Admission: EM | Admit: 2023-03-04 | Discharge: 2023-03-04 | Disposition: A | Discharge status: Home / Self Care | Payer: Self-pay

## 2023-03-04 DIAGNOSIS — Z1152 Encounter for screening for COVID-19: Secondary | ICD-10-CM | POA: Insufficient documentation

## 2023-03-04 DIAGNOSIS — G40909 Epilepsy, unspecified, not intractable, without status epilepticus: Secondary | ICD-10-CM | POA: Insufficient documentation

## 2023-03-04 DIAGNOSIS — J020 Streptococcal pharyngitis: Secondary | ICD-10-CM | POA: Insufficient documentation

## 2023-03-04 DIAGNOSIS — I10 Essential (primary) hypertension: Secondary | ICD-10-CM | POA: Insufficient documentation

## 2023-03-04 LAB — RESP PANEL BY RT-PCR (RSV, FLU A&B, COVID)  RVPGX2
Influenza A by PCR: NEGATIVE
Influenza B by PCR: NEGATIVE
Resp Syncytial Virus by PCR: NEGATIVE
SARS Coronavirus 2 by RT PCR: NEGATIVE

## 2023-03-04 LAB — GROUP A STREP BY PCR: Group A Strep by PCR: DETECTED — AB

## 2023-03-04 MED ORDER — CEPHALEXIN 250 MG PO CAPS
500.0000 mg | ORAL_CAPSULE | Freq: Once | ORAL | Status: AC
  Administered 2023-03-04: 500 mg via ORAL
  Filled 2023-03-04: qty 2

## 2023-03-04 MED ORDER — CEPHALEXIN 500 MG PO CAPS: 500.0000 mg | ORAL_CAPSULE | Freq: Four times a day (QID) | ORAL | 0 refills | Status: AC

## 2023-03-04 MED ORDER — HYDROCHLOROTHIAZIDE 25 MG PO TABS: 25.0000 mg | ORAL_TABLET | Freq: Every day | ORAL | 1 refills | Status: DC

## 2023-03-04 MED ORDER — DIVALPROEX SODIUM 500 MG PO DR TAB: 500.0000 mg | DELAYED_RELEASE_TABLET | Freq: Two times a day (BID) | ORAL | 1 refills | Status: DC

## 2023-03-04 MED ORDER — DIVALPROEX SODIUM 250 MG PO DR TAB
500.0000 mg | DELAYED_RELEASE_TABLET | Freq: Once | ORAL | Status: AC
  Administered 2023-03-04: 500 mg via ORAL
  Filled 2023-03-04: qty 2

## 2023-03-04 NOTE — Telephone Encounter (Signed)
Pt is scheduled for a new patient appointment with Dr. Teresa Coombs for 04/04/23 and appointment is on wait list. Pt came in asking if we could send a refill of divalproex (DEPAKOTE ER) 500 MG 24 hr tablet to [facility] on [address]

## 2023-03-04 NOTE — Discharge Instructions (Signed)
Return if any problems.  Follow up with neurology as scheduled.

## 2023-03-04 NOTE — ED Triage Notes (Signed)
The pt was seen at her neurologist this am and they were supposed to call in her meds that she is almost out of   this med is for seizures  divalproex  is the med   the doctors office closed and this was their only option

## 2023-03-05 NOTE — ED Provider Notes (Signed)
[place] EMERGENCY DEPARTMENT AT [facility] Provider Note   CSN: [id number] Arrival date & time: 03/04/23  1716     History  Chief Complaint  Patient presents with   needs rx    Alison Collins is a 52 y.o. female.  Patient complains of a sore throat and swollen glands.  Patient also reports that she is running out of her Depakote and she is out of her blood pressure medications.  Patient reports that she saw the neurologist today but they did not call in her Depakote.  Patient denies any current fevers she has no shortness of breath.  The history is provided by the patient. No language interpreter was used.       Home Medications Prior to Admission medications   Medication Sig Start Date End Date Taking? Authorizing Provider  cephALEXin (KEFLEX) 500 MG capsule Take 1 capsule (500 mg total) by mouth 4 (four) times daily for 10 days. 03/04/23 03/14/23 Yes Elson Areas, PA-C  divalproex (DEPAKOTE) 500 MG DR tablet Take 1 tablet (500 mg total) by mouth 2 (two) times daily. 03/04/23  Yes Cheron Schaumann K, PA-C  hydrochlorothiazide (HYDRODIURIL) 25 MG tablet Take 1 tablet (25 mg total) by mouth daily. 03/04/23  Yes Cheron Schaumann K, PA-C  methocarbamol (ROBAXIN) 500 MG tablet Take 1 tablet (500 mg total) by mouth 2 (two) times daily. 12/04/20   McDonald, Mia A, PA-C  predniSONE (STERAPRED UNI-PAK 21 TAB) 10 MG (21) TBPK tablet Take by mouth daily. Take 6 tabs by mouth daily  for 2 days, then 5 tabs for 2 days, then 4 tabs for 2 days, then 3 tabs for 2 days, 2 tabs for 2 days, then 1 tab by mouth daily for 2 days 12/04/20   McDonald, Mia A, PA-C      Allergies    Gluten meal and Penicillins    Review of Systems   Review of Systems  All other systems reviewed and are negative.   Physical Exam Updated Vital Signs BP (!) 168/90 (BP Location: Right Arm)   Pulse 89   Temp 100.2 F (37.9 C) (Oral)   Resp 19   Ht 5\' 3"  (1.6 m)   Wt 90.7 kg   LMP 05/16/2017 (Approximate)    SpO2 99%   BMI 35.42 kg/m  Physical Exam Vitals and nursing note reviewed.  Constitutional:      Appearance: She is well-developed.  HENT:     Head: Normocephalic.     Mouth/Throat:     Mouth: Mucous membranes are moist.     Pharynx: Posterior oropharyngeal erythema present.  Cardiovascular:     Rate and Rhythm: Normal rate.  Pulmonary:     Effort: Pulmonary effort is normal.  Abdominal:     General: There is no distension.  Musculoskeletal:        General: Normal range of motion.  Skin:    General: Skin is warm.  Neurological:     General: No focal deficit present.     Mental Status: She is alert and oriented to person, place, and time.     ED Results / Procedures / Treatments   Labs (all labs ordered are listed, but only abnormal results are displayed) Labs Reviewed  GROUP A STREP BY PCR - Abnormal; Notable for the following components:      Result Value   Group A Strep by PCR DETECTED (*)    All other components within normal limits  RESP PANEL BY RT-PCR (RSV,  FLU A&B, COVID)  [id number]    EKG None  Radiology No results found.  Procedures Procedures    Medications Ordered in ED Medications  divalproex (DEPAKOTE) DR tablet 500 mg (500 mg Oral Given 03/04/23 2037)  cephALEXin (KEFLEX) capsule 500 mg (500 mg Oral Given 03/04/23 2209)    ED Course/ Medical Decision Making/ A&P Clinical Course as of 03/05/23 1532  Fri Mar 04, 2023  2136 Group A Strep by PCR(!): DETECTED [LS]  2137 Group A Strep by PCR(!) [LS]    Clinical Course User Index [LS] Elson Areas, PA-C                                 Medical Decision Making Patient complains of a sore throat.  She is also requesting a refill of her seizure medicine.  Patient reports she is also out of her blood pressure medicine.  Amount and/or Complexity of Data Reviewed Independent Historian: spouse    Details: Is here with her spouse who is supportive Labs: ordered. Decision-making details documented in  ED Course.    Details: Abs ordered reviewed and interpreted strep screen is positive.  Risk Prescription drug management. Risk Details: Is given a dose of her Depakote here.  Patient is given a dose of Keflex.  Patient is counseled on strep pharyngitis.  Patient is advised to follow-up with her neurologist and primary care.  Patient is discharged in stable condition           Final Clinical Impression(s) / ED Diagnoses Final diagnoses:  None    Rx / DC Orders ED Discharge Orders          Ordered    divalproex (DEPAKOTE) 500 MG DR tablet  2 times daily        03/04/23 2020    hydrochlorothiazide (HYDRODIURIL) 25 MG tablet  Daily        03/04/23 2023    cephALEXin (KEFLEX) 500 MG capsule  4 times daily        03/04/23 2140           An After Visit Summary was printed and given to the patient.    Elson Areas, [place] 03/05/23 1534    Durwin Glaze, MD 03/07/23 [phone]

## 2023-03-07 ENCOUNTER — Telehealth: Payer: Self-pay

## 2023-03-07 NOTE — Telephone Encounter (Signed)
Thank you :)

## 2023-03-07 NOTE — Telephone Encounter (Signed)
Call to patient on mobile phone, no answer. Called her boyfriend, [name],  and he states he does have the medication. Reviewed upcoming date and time for appointment.

## 2023-03-07 NOTE — Telephone Encounter (Signed)
Noted  

## 2023-03-07 NOTE — Telephone Encounter (Signed)
Ok to send her one month supply.

## 2023-04-04 ENCOUNTER — Encounter: Payer: Self-pay | Admitting: Neurology

## 2023-04-04 ENCOUNTER — Ambulatory Visit: Payer: Self-pay | Admitting: Neurology

## 2023-05-14 ENCOUNTER — Other Ambulatory Visit: Payer: Self-pay

## 2023-05-14 ENCOUNTER — Encounter (HOSPITAL_COMMUNITY): Payer: Self-pay | Admitting: *Deleted

## 2023-05-14 ENCOUNTER — Emergency Department (HOSPITAL_COMMUNITY): Payer: Self-pay

## 2023-05-14 ENCOUNTER — Emergency Department (HOSPITAL_COMMUNITY)
Admission: EM | Admit: 2023-05-14 | Discharge: 2023-05-14 | Disposition: A | Discharge status: Home / Self Care | Payer: Self-pay | Attending: Emergency Medicine | Admitting: Emergency Medicine

## 2023-05-14 ENCOUNTER — Other Ambulatory Visit (HOSPITAL_COMMUNITY): Payer: Self-pay

## 2023-05-14 DIAGNOSIS — R569 Unspecified convulsions: Secondary | ICD-10-CM

## 2023-05-14 DIAGNOSIS — M1710 Unilateral primary osteoarthritis, unspecified knee: Secondary | ICD-10-CM

## 2023-05-14 DIAGNOSIS — G40909 Epilepsy, unspecified, not intractable, without status epilepticus: Secondary | ICD-10-CM | POA: Insufficient documentation

## 2023-05-14 DIAGNOSIS — Z79899 Other long term (current) drug therapy: Secondary | ICD-10-CM | POA: Insufficient documentation

## 2023-05-14 DIAGNOSIS — M1711 Unilateral primary osteoarthritis, right knee: Secondary | ICD-10-CM | POA: Insufficient documentation

## 2023-05-14 DIAGNOSIS — I10 Essential (primary) hypertension: Secondary | ICD-10-CM | POA: Insufficient documentation

## 2023-05-14 LAB — VALPROIC ACID LEVEL: Valproic Acid Lvl: 66 ug/mL (ref 50.0–100.0)

## 2023-05-14 LAB — HEPATIC FUNCTION PANEL
ALT: 19 U/L (ref 0–44)
AST: 24 U/L (ref 15–41)
Albumin: 3.3 g/dL — ABNORMAL LOW (ref 3.5–5.0)
Alkaline Phosphatase: 57 U/L (ref 38–126)
Bilirubin, Direct: 0.2 mg/dL (ref 0.0–0.2)
Indirect Bilirubin: 0.6 mg/dL (ref 0.3–0.9)
Total Bilirubin: 0.8 mg/dL (ref <1.2)
Total Protein: 6.8 g/dL (ref 6.5–8.1)

## 2023-05-14 LAB — BASIC METABOLIC PANEL
Anion gap: 10 (ref 5–15)
BUN: 19 mg/dL (ref 6–20)
CO2: 24 mmol/L (ref 22–32)
Calcium: 9 mg/dL (ref 8.9–10.3)
Chloride: 105 mmol/L (ref 98–111)
Creatinine, Ser: 0.95 mg/dL (ref 0.44–1.00)
GFR, Estimated: >60 mL/min (ref >60)
Glucose, Bld: 133 mg/dL — ABNORMAL HIGH (ref 70–99)
Potassium: 4.1 mmol/L (ref 3.5–5.1)
Sodium: 139 mmol/L (ref 135–145)

## 2023-05-14 LAB — CBC
HCT: 40.5 % (ref 36.0–46.0)
Hemoglobin: 13.4 g/dL (ref 12.0–15.0)
MCH: 28 pg (ref 26.0–34.0)
MCHC: 33.1 g/dL (ref 30.0–36.0)
MCV: 84.7 fL (ref 80.0–100.0)
Platelets: 189 10*3/uL (ref 150–400)
RBC: 4.78 MIL/uL (ref 3.87–5.11)
RDW: 15.7 % — ABNORMAL HIGH (ref 11.5–15.5)
WBC: 6 10*3/uL (ref 4.0–10.5)
nRBC: 0 % (ref 0.0–0.2)

## 2023-05-14 LAB — MAGNESIUM: Magnesium: 2.1 mg/dL (ref 1.7–2.4)

## 2023-05-14 MED ORDER — MECLIZINE HCL 25 MG PO TABS
25.0000 mg | ORAL_TABLET | Freq: Once | ORAL | Status: AC
  Administered 2023-05-14: 25 mg via ORAL
  Filled 2023-05-14: qty 1

## 2023-05-14 MED ORDER — DIVALPROEX SODIUM 500 MG PO DR TAB
500.0000 mg | DELAYED_RELEASE_TABLET | Freq: Two times a day (BID) | ORAL | 1 refills | Status: DC
  Filled 2023-05-14: qty 60, 30d supply, fill #0

## 2023-05-14 MED ORDER — HYDROCHLOROTHIAZIDE 25 MG PO TABS
25.0000 mg | ORAL_TABLET | Freq: Every day | ORAL | 1 refills | Status: DC
  Filled 2023-05-14: qty 30, 30d supply, fill #0

## 2023-05-14 MED ORDER — SODIUM CHLORIDE 0.9 % IV BOLUS
500.0000 mL | Freq: Once | INTRAVENOUS | Status: AC
  Administered 2023-05-14: 500 mL via INTRAVENOUS

## 2023-05-14 NOTE — ED Provider Notes (Signed)
[place] EMERGENCY DEPARTMENT AT [facility] Provider Note   CSN: [id number] Arrival date & time: 05/14/23  1118     History  Chief Complaint  Patient presents with   Seizures   Knee Pain    Alison Collins is a 52 y.o. female.  Pt is a 52 yo female with pmhx significant for seizures, htn, anxiety, depression, and seizures.  Pt was on keppra, but it made her too tired.  She was switched to depakote several years ago. Pt has accidentally skipped a few doses of the depakote.  Pt does not have a pcp.  She missed her last appt to the neurologist b/c she did not have transportation.  She does not have insurance.  She is also out of her hydrochlorothiazide.  She did have a seizure yesterday.  She feels dizzy today.  She has right knee pain.  She does not think she fell on it.          Home Medications Prior to Admission medications   Medication Sig Start Date End Date Taking? Authorizing Provider  divalproex (DEPAKOTE) 500 MG DR tablet Take 1 tablet (500 mg total) by mouth 2 (two) times daily. 05/14/23   Jacalyn Lefevre, MD  hydrochlorothiazide (HYDRODIURIL) 25 MG tablet Take 1 tablet (25 mg total) by mouth daily. 05/14/23   Jacalyn Lefevre, MD  methocarbamol (ROBAXIN) 500 MG tablet Take 1 tablet (500 mg total) by mouth 2 (two) times daily. 12/04/20   McDonald, Mia A, PA-C  predniSONE (STERAPRED UNI-PAK 21 TAB) 10 MG (21) TBPK tablet Take by mouth daily. Take 6 tabs by mouth daily  for 2 days, then 5 tabs for 2 days, then 4 tabs for 2 days, then 3 tabs for 2 days, 2 tabs for 2 days, then 1 tab by mouth daily for 2 days 12/04/20   McDonald, Mia A, PA-C      Allergies    Gluten meal and Penicillins    Review of Systems   Review of Systems  Neurological:  Positive for seizures.  All other systems reviewed and are negative.   Physical Exam Updated Vital Signs BP (!) 181/120   Pulse 72   Temp 98.3 F (36.8 C)   Resp 20   LMP 05/16/2017 (Approximate)   SpO2 100%   Physical Exam Vitals and nursing note reviewed.  Constitutional:      Appearance: Normal appearance. She is obese.  HENT:     Head: Normocephalic and atraumatic.     Right Ear: External ear normal.     Left Ear: External ear normal.     Nose: Nose normal.     Mouth/Throat:     Mouth: Mucous membranes are moist.     Pharynx: Oropharynx is clear.  Eyes:     Extraocular Movements: Extraocular movements intact.     Conjunctiva/sclera: Conjunctivae normal.     Pupils: Pupils are equal, round, and reactive to light.  Cardiovascular:     Rate and Rhythm: Normal rate and regular rhythm.     Pulses: Normal pulses.     Heart sounds: Normal heart sounds.  Pulmonary:     Effort: Pulmonary effort is normal.     Breath sounds: Normal breath sounds.  Abdominal:     General: Abdomen is flat. Bowel sounds are normal.     Palpations: Abdomen is soft.  Musculoskeletal:        General: Normal range of motion.     Cervical back: Normal range of motion and  neck supple.  Skin:    General: Skin is warm.     Capillary Refill: Capillary refill takes less than 2 seconds.  Neurological:     General: No focal deficit present.     Mental Status: She is alert and oriented to person, place, and time.  Psychiatric:        Mood and Affect: Mood normal.        Behavior: Behavior normal.     ED Results / Procedures / Treatments   Labs (all labs ordered are listed, but only abnormal results are displayed) Labs Reviewed  BASIC METABOLIC PANEL - Abnormal; Notable for the following components:      Result Value   Glucose, Bld 133 (*)    All other components within normal limits  CBC - Abnormal; Notable for the following components:   RDW 15.7 (*)    All other components within normal limits  HEPATIC FUNCTION PANEL - Abnormal; Notable for the following components:   Albumin 3.3 (*)    All other components within normal limits  VALPROIC ACID LEVEL  MAGNESIUM  URINALYSIS, ROUTINE W REFLEX MICROSCOPIC   CBG MONITORING, ED    EKG EKG Interpretation Date/Time:  Saturday May 14 2023 12:10:29 EST Ventricular Rate:  79 PR Interval:  165 QRS Duration:  88 QT Interval:  362 QTC Calculation: 415 R Axis:   40  Text Interpretation: Sinus rhythm Ventricular premature complex Anteroseptal infarct, old No significant change since last tracing Confirmed by Jacalyn Lefevre [phone]) on 05/14/2023 12:14:34 PM  Radiology DG Knee Complete 4 Views Right  Result Date: 05/14/2023 CLINICAL DATA:  Pain after a fall. EXAM: RIGHT KNEE - COMPLETE 4+ VIEW COMPARISON:  None Available. FINDINGS: Mild loss of joint space medial compartment. Mild hypertrophic spurring is noted in all 3 compartments. No joint effusion. No acute fracture or evidence of dislocation. IMPRESSION: Mild tricompartmental degenerative changes without acute bony findings. Electronically Signed   By: Kennith Center M.D.   On: 05/14/2023 12:59    Procedures Procedures    Medications Ordered in ED Medications  sodium chloride 0.9 % bolus 500 mL (500 mLs Intravenous New Bag/Given 05/14/23 1220)  meclizine (ANTIVERT) tablet 25 mg (25 mg Oral Given 05/14/23 1221)    ED Course/ Medical Decision Making/ A&P                                 Medical Decision Making Amount and/or Complexity of Data Reviewed Labs: ordered. Radiology: ordered.  Risk Prescription drug management.   This patient presents to the ED for concern of seizure, this involves an extensive number of treatment options, and is a complaint that carries with it a high risk of complications and morbidity.  The differential diagnosis includes noncompliance, electrolyte abn, infection   Co morbidities that complicate the patient evaluation  seizures, htn, anxiety, depression, and seizures   Additional history obtained:  Additional history obtained from epic review External records from outside source obtained and reviewed including husband   Lab Tests:  I  Ordered, and personally interpreted labs.  The pertinent results include:  cbc nl, bmp nl, lfts nl   Imaging Studies ordered:  I ordered imaging studies including r knee  I independently visualized and interpreted imaging which showed  Mild tricompartmental degenerative changes without acute bony  findings.   I agree with the radiologist interpretation   Cardiac Monitoring:  The patient was maintained on a cardiac  monitor.  I personally viewed and interpreted the cardiac monitored which showed an underlying rhythm of: nsr   Medicines ordered and prescription drug management:  I ordered medication including antivert  for sx  Reevaluation of the patient after these medicines showed that the patient improved I have reviewed the patients home medicines and have made adjustments as needed   Test Considered:  xr   Critical Interventions:  Toc consult   Consultations Obtained:  I requested consultation with the toc,  and discussed lab and imaging findings as well as pertinent plan - they have filled 1 month supply of meds.     Problem List / ED Course:  Seizure:  pt is awake and alert.  She is feeling much better.  We have supplied her with 1 month supply of meds.  She is to f/u with [facility].  Return if worse.   R knee:  OA   Reevaluation:  After the interventions noted above, I reevaluated the patient and found that they have :improved   Social Determinants of Health:  Lives at home   Dispostion:  After consideration of the diagnostic results and the patients response to treatment, I feel that the patent would benefit from discharge with outpatient f/u.          Final Clinical Impression(s) / ED Diagnoses Final diagnoses:  Seizures (HCC)  Arthritis of knee  Hypertension, unspecified type    Rx / DC Orders ED Discharge Orders          Ordered    divalproex (DEPAKOTE) 500 MG DR tablet  2 times daily        05/14/23 1219    hydrochlorothiazide  (HYDRODIURIL) 25 MG tablet  Daily        05/14/23 1219              Jacalyn Lefevre, MD 05/14/23 1532

## 2023-05-14 NOTE — ED Notes (Signed)
 CCMD called.

## 2023-05-14 NOTE — ED Triage Notes (Addendum)
BIB family from home for R knee pain, and sz x2 yesterday. H/o sz. "Not sure if knee pain r/t fall or sz. No neuro MD. Takes Depakote prescribed in ED. Ran out of hydrochlorothiazide. Pt alert, NAD, calm, interactive, resps e/u, speaking in clear complete sentences. VSS. BP high. Family present. Ambulatory. Denies NVD, fever. "Feel a little tipsy".

## 2023-05-14 NOTE — Care Management (Signed)
Transition of Care [facility]) - Emergency Department Mini Assessment   Patient Details  Name: Alison Collins MRN: [id number] Date of Birth: Feb 20, 1971  Transition of Care [facility]) CM/SW Contact:    Lockie Pares, RN Phone Number: 05/14/2023, 12:30 PM   Clinical Narrative: 52 year old who  was set up with neurology on October 28  presents with seizure and knee pain. . She has run out of her medication. Hydrochlorothiazide and depacote. This has been ordered in the ED to [facility] pharmacy.    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 [place] [address] Phone: [phone] Fax: [phone]  [id number] - [place],  - [address] [place] [address] Phone: [phone] Fax: [phone]  [facility] [address] [place] [address] Phone: [phone] Fax: [phone]

## 2023-06-05 DIAGNOSIS — R4182 Altered mental status, unspecified: Secondary | ICD-10-CM | POA: Insufficient documentation

## 2023-06-05 DIAGNOSIS — J9601 Acute respiratory failure with hypoxia: Secondary | ICD-10-CM | POA: Insufficient documentation

## 2023-06-06 DIAGNOSIS — Q030 Malformations of aqueduct of Sylvius: Secondary | ICD-10-CM | POA: Insufficient documentation

## 2023-06-06 DIAGNOSIS — U071 COVID-19: Secondary | ICD-10-CM | POA: Insufficient documentation

## 2023-06-06 DIAGNOSIS — E876 Hypokalemia: Secondary | ICD-10-CM | POA: Insufficient documentation

## 2023-06-06 DIAGNOSIS — R7309 Other abnormal glucose: Secondary | ICD-10-CM | POA: Insufficient documentation

## 2023-06-06 DIAGNOSIS — R569 Unspecified convulsions: Secondary | ICD-10-CM | POA: Insufficient documentation

## 2023-06-19 ENCOUNTER — Other Ambulatory Visit: Payer: Self-pay

## 2023-06-19 ENCOUNTER — Inpatient Hospital Stay (HOSPITAL_COMMUNITY)
Admission: EM | Admit: 2023-06-19 | Discharge: 2023-06-29 | DRG: 027 | Disposition: A | Discharge status: Home / Self Care | Payer: Commercial Managed Care - HMO | Attending: Internal Medicine | Admitting: Internal Medicine

## 2023-06-19 ENCOUNTER — Encounter (HOSPITAL_COMMUNITY): Payer: Self-pay

## 2023-06-19 DIAGNOSIS — G911 Obstructive hydrocephalus: Principal | ICD-10-CM

## 2023-06-19 DIAGNOSIS — E66813 Obesity, class 3: Secondary | ICD-10-CM | POA: Diagnosis present

## 2023-06-19 DIAGNOSIS — I10 Essential (primary) hypertension: Secondary | ICD-10-CM | POA: Diagnosis present

## 2023-06-19 DIAGNOSIS — E119 Type 2 diabetes mellitus without complications: Secondary | ICD-10-CM

## 2023-06-19 DIAGNOSIS — G40209 Localization-related (focal) (partial) symptomatic epilepsy and epileptic syndromes with complex partial seizures, not intractable, without status epilepticus: Secondary | ICD-10-CM | POA: Diagnosis present

## 2023-06-19 DIAGNOSIS — R296 Repeated falls: Secondary | ICD-10-CM | POA: Diagnosis present

## 2023-06-19 DIAGNOSIS — Z23 Encounter for immunization: Secondary | ICD-10-CM

## 2023-06-19 DIAGNOSIS — Z8632 Personal history of gestational diabetes: Secondary | ICD-10-CM

## 2023-06-19 DIAGNOSIS — Z8249 Family history of ischemic heart disease and other diseases of the circulatory system: Secondary | ICD-10-CM

## 2023-06-19 DIAGNOSIS — Z6835 Body mass index (BMI) 35.0-35.9, adult: Secondary | ICD-10-CM

## 2023-06-19 DIAGNOSIS — Z820 Family history of epilepsy and other diseases of the nervous system: Secondary | ICD-10-CM

## 2023-06-19 DIAGNOSIS — Z7984 Long term (current) use of oral hypoglycemic drugs: Secondary | ICD-10-CM

## 2023-06-19 DIAGNOSIS — Z880 Allergy status to penicillin: Secondary | ICD-10-CM

## 2023-06-19 DIAGNOSIS — G919 Hydrocephalus, unspecified: Principal | ICD-10-CM | POA: Diagnosis present

## 2023-06-19 DIAGNOSIS — Z91018 Allergy to other foods: Secondary | ICD-10-CM

## 2023-06-19 DIAGNOSIS — Z79899 Other long term (current) drug therapy: Secondary | ICD-10-CM

## 2023-06-19 DIAGNOSIS — R260 Ataxic gait: Secondary | ICD-10-CM | POA: Diagnosis present

## 2023-06-19 DIAGNOSIS — Z9181 History of falling: Secondary | ICD-10-CM

## 2023-06-19 DIAGNOSIS — E669 Obesity, unspecified: Secondary | ICD-10-CM | POA: Diagnosis present

## 2023-06-19 DIAGNOSIS — Z8616 Personal history of COVID-19: Secondary | ICD-10-CM

## 2023-06-19 DIAGNOSIS — G40909 Epilepsy, unspecified, not intractable, without status epilepticus: Secondary | ICD-10-CM | POA: Diagnosis present

## 2023-06-19 HISTORY — DX: Type 2 diabetes mellitus without complications: E11.9

## 2023-06-19 LAB — CBC WITH DIFFERENTIAL/PLATELET
Abs Immature Granulocytes: 0.01 10*3/uL (ref 0.00–0.07)
Basophils Absolute: 0 10*3/uL (ref 0.0–0.1)
Basophils Relative: 0 %
Eosinophils Absolute: 0 10*3/uL (ref 0.0–0.5)
Eosinophils Relative: 0 %
HCT: 40.7 % (ref 36.0–46.0)
Hemoglobin: 13.5 g/dL (ref 12.0–15.0)
Immature Granulocytes: 0 %
Lymphocytes Relative: 49 %
Lymphs Abs: 2.9 10*3/uL (ref 0.7–4.0)
MCH: 28.8 pg (ref 26.0–34.0)
MCHC: 33.2 g/dL (ref 30.0–36.0)
MCV: 86.8 fL (ref 80.0–100.0)
Monocytes Absolute: 0.5 10*3/uL (ref 0.1–1.0)
Monocytes Relative: 8 %
Neutro Abs: 2.6 10*3/uL (ref 1.7–7.7)
Neutrophils Relative %: 43 %
Platelets: 175 10*3/uL (ref 150–400)
RBC: 4.69 MIL/uL (ref 3.87–5.11)
RDW: 15.7 % — ABNORMAL HIGH (ref 11.5–15.5)
WBC: 6.1 10*3/uL (ref 4.0–10.5)
nRBC: 0 % (ref 0.0–0.2)

## 2023-06-19 LAB — BASIC METABOLIC PANEL
Anion gap: 11 (ref 5–15)
BUN: 13 mg/dL (ref 6–20)
CO2: 25 mmol/L (ref 22–32)
Calcium: 9 mg/dL (ref 8.9–10.3)
Chloride: 102 mmol/L (ref 98–111)
Creatinine, Ser: 0.75 mg/dL (ref 0.44–1.00)
GFR, Estimated: >60 mL/min (ref >60)
Glucose, Bld: 91 mg/dL (ref 70–99)
Potassium: 3.7 mmol/L (ref 3.5–5.1)
Sodium: 138 mmol/L (ref 135–145)

## 2023-06-19 LAB — CBG MONITORING, ED: Glucose-Capillary: 85 mg/dL (ref 70–99)

## 2023-06-19 LAB — VALPROIC ACID LEVEL: Valproic Acid Lvl: 133 ug/mL — ABNORMAL HIGH (ref 50.0–100.0)

## 2023-06-19 MED ORDER — ACETAMINOPHEN 325 MG PO TABS: 650.0000 mg | ORAL_TABLET | Freq: Four times a day (QID) | ORAL | Status: DC | PRN

## 2023-06-19 MED ORDER — ACETAMINOPHEN 650 MG RE SUPP: 650.0000 mg | Freq: Four times a day (QID) | RECTAL | Status: DC | PRN

## 2023-06-19 MED ORDER — PNEUMOCOCCAL 20-VAL CONJ VACC 0.5 ML IM SUSY
0.5000 mL | PREFILLED_SYRINGE | INTRAMUSCULAR | Status: AC
  Administered 2023-06-21: 0.5 mL via INTRAMUSCULAR
  Filled 2023-06-19: qty 0.5

## 2023-06-19 MED ORDER — INFLUENZA VAC A&B SURF ANT ADJ 0.5 ML IM SUSY
0.5000 mL | PREFILLED_SYRINGE | INTRAMUSCULAR | Status: AC
  Administered 2023-06-21: 0.5 mL via INTRAMUSCULAR
  Filled 2023-06-19: qty 0.5

## 2023-06-19 MED ORDER — SENNOSIDES-DOCUSATE SODIUM 8.6-50 MG PO TABS: 1.0000 | ORAL_TABLET | Freq: Every evening | ORAL | Status: DC | PRN

## 2023-06-19 MED ORDER — INSULIN ASPART 100 UNIT/ML IJ SOLN: 0.0000 [IU] | Freq: Every day | INTRAMUSCULAR | Status: DC

## 2023-06-19 MED ORDER — SODIUM CHLORIDE 0.9% FLUSH
3.0000 mL | Freq: Two times a day (BID) | INTRAVENOUS | Status: DC
  Administered 2023-06-20 – 2023-06-29 (×12): 3 mL via INTRAVENOUS

## 2023-06-19 MED ORDER — INSULIN ASPART 100 UNIT/ML IJ SOLN: 0.0000 [IU] | Freq: Three times a day (TID) | INTRAMUSCULAR | Status: DC

## 2023-06-19 MED ORDER — HYDROCHLOROTHIAZIDE 25 MG PO TABS
25.0000 mg | ORAL_TABLET | Freq: Every day | ORAL | Status: DC
  Administered 2023-06-20 – 2023-06-29 (×10): 25 mg via ORAL
  Filled 2023-06-19 (×10): qty 1

## 2023-06-19 NOTE — Care Management (Signed)
PCP information added to AVS.

## 2023-06-19 NOTE — ED Provider Notes (Addendum)
 [place] EMERGENCY DEPARTMENT AT [facility] Provider Note   CSN: [id number] Arrival date & time: 06/19/23  1724     History  Chief Complaint  Patient presents with   Frequent falls    Alison Collins is a 53 y.o. female.  53 year old female presents with increased ataxia and falls.  Patient was diagnosed with noncommunicating hydrocephalus about 2 weeks ago in [place] .  Was told to follow-up with neurosurgery.  Since that time, she has noted increasing ataxia.  States that she is fine when she is still but when she tries to walk around she feels like she is going to fall.  Denies any injuries from this.  No headache, vomiting.  No visual changes.  No focal weakness.  Has not seen a neurosurgeon yet here.       Home Medications Prior to Admission medications   Medication Sig Start Date End Date Taking? Authorizing Provider  divalproex  (DEPAKOTE ) 500 MG DR tablet Take 1 tablet (500 mg total) by mouth 2 (two) times daily. 05/14/23   Dean Clarity, MD  hydrochlorothiazide  (HYDRODIURIL ) 25 MG tablet Take 1 tablet (25 mg total) by mouth daily. 05/14/23   Dean Clarity, MD  methocarbamol  (ROBAXIN ) 500 MG tablet Take 1 tablet (500 mg total) by mouth 2 (two) times daily. 12/04/20   McDonald, Mia A, PA-C  predniSONE  (STERAPRED UNI-PAK 21 TAB) 10 MG (21) TBPK tablet Take by mouth daily. Take 6 tabs by mouth daily  for 2 days, then 5 tabs for 2 days, then 4 tabs for 2 days, then 3 tabs for 2 days, 2 tabs for 2 days, then 1 tab by mouth daily for 2 days 12/04/20   McDonald, Mia A, PA-C      Allergies    Gluten meal and Penicillins    Review of Systems   Review of Systems  All other systems reviewed and are negative.   Physical Exam Updated Vital Signs BP (!) 170/84   Pulse 71   Temp (!) 97.4 F (36.3 C) (Oral)   Resp 17   LMP 05/16/2017 (Approximate)   SpO2 100%  Physical Exam Vitals and nursing note reviewed.  Constitutional:      General: She is not in acute  distress.    Appearance: Normal appearance. She is well-developed. She is not toxic-appearing.  HENT:     Head: Normocephalic and atraumatic.  Eyes:     General: Lids are normal.     Conjunctiva/sclera: Conjunctivae normal.     Pupils: Pupils are equal, round, and reactive to light.  Neck:     Thyroid : No thyroid  mass.     Trachea: No tracheal deviation.  Cardiovascular:     Rate and Rhythm: Normal rate and regular rhythm.     Heart sounds: Normal heart sounds. No murmur heard.    No gallop.  Pulmonary:     Effort: Pulmonary effort is normal. No respiratory distress.     Breath sounds: Normal breath sounds. No stridor. No decreased breath sounds, wheezing, rhonchi or rales.  Abdominal:     General: There is no distension.     Palpations: Abdomen is soft.     Tenderness: There is no abdominal tenderness. There is no rebound.  Musculoskeletal:        General: No tenderness. Normal range of motion.     Cervical back: Normal range of motion and neck supple.  Skin:    General: Skin is warm and dry.     Findings: No abrasion  or rash.  Neurological:     Mental Status: She is alert and oriented to person, place, and time. Mental status is at baseline.     GCS: GCS eye subscore is 4. GCS verbal subscore is 5. GCS motor subscore is 6.     Cranial Nerves: Cranial nerves 2-12 are intact. No cranial nerve deficit.     Sensory: No sensory deficit.     Motor: Motor function is intact.     Coordination: Finger-Nose-Finger Test normal.     Comments: Gait not tested due to concern for falls  Psychiatric:        Attention and Perception: Attention normal.        Speech: Speech normal.        Behavior: Behavior normal.     ED Results / Procedures / Treatments   Labs (all labs ordered are listed, but only abnormal results are displayed) Labs Reviewed  BASIC METABOLIC PANEL  URINALYSIS, ROUTINE W REFLEX MICROSCOPIC  CBC WITH DIFFERENTIAL/PLATELET  CBG MONITORING, ED    EKG EKG  Interpretation Date/Time:  Sunday June 19 2023 18:49:36 EST Ventricular Rate:  71 PR Interval:  170 QRS Duration:  79 QT Interval:  503 QTC Calculation: 547 R Axis:   60  Text Interpretation: Sinus rhythm Anteroseptal infarct, old Nonspecific T abnormalities, lateral leads Minimal ST elevation, inferior leads Prolonged QT interval Confirmed by Dasie Faden ([id number]) on 06/19/2023 7:49:06 PM  Radiology No results found.  Procedures Procedures    Medications Ordered in ED Medications - No data to display  ED Course/ Medical Decision Making/ A&P                                 Medical Decision Making Amount and/or Complexity of Data Reviewed Labs: ordered. ECG/medicine tests: ordered.  Patient is EKG performed tracing shows normal sinus rhythm Discussed case with neurosurgeon on-call, Dr. Gillie, recommends medicine admission and his team will follow-up in the morning.  He did not recommend any further imaging at this time because patient has a disc that has been imaging from less than 2 weeks ago.   will consult hospitalist team        Final Clinical Impression(s) / ED Diagnoses Final diagnoses:  None    Rx / DC Orders ED Discharge Orders     None         Dasie Faden, MD 06/19/23 WINDELL Dasie Faden, MD 06/19/23 1949

## 2023-06-19 NOTE — H&P (Signed)
 History and Physical    Alison Collins FMW:[id number] DOB: 15-Nov-1970 DOA: 06/19/2023  PCP: Patient, No Pcp Per   Patient coming from: Home   Chief Complaint: Gait dysfunction, confusion, hydrocephalus  HPI: Alison Collins is a 53 y.o. female with medical history significant for hypertension, seizures, and recent diagnosis of diabetes mellitus who presents for evaluation of confusion, falls, and hydrocephalus.  Patient had presented to a hospital in [place]  on 06/05/2023 with confusion, dizziness, and nausea.  She was found to have COVID-19 infection with acute hypoxic respiratory failure, was treated with remdesivir, and recovered.  She was also found to have hydrocephalus on CT.  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  She was evaluated by neurosurgery at that time and advised to follow-up in the outpatient clinic in 3 weeks.  She was also diagnosed with diabetes during the admission, having an A1c of 6.9%.  Since her discharge from the hospital in [place]  on 06/06/2023, she has had waxing and waning confusion, difficulty with her balance and gait, and ongoing falls.  She has not had any seizures per report of her husband.  Patient's husband believes that she has been taking extra doses of her medications due to forgetting that she had already taken them.  ED Course: Upon arrival to the ED, patient is found to be afebrile and saturating well on room air with normal heart rate and stable blood pressure.  Labs are notable for a valproic  acid level of 133.  Neurosurgery (Dr. Gillie) was consulted by the ED physician.  Review of Systems:  ROS limited by patient's clinical condition.  Past Medical History:  Diagnosis Date   Anemia    GDM (gestational diabetes mellitus)    Hypertension    Migraine    Non-insulin  dependent type 2 diabetes  mellitus (HCC) 06/19/2023   Normal spontaneous vaginal delivery    Seizures (HCC)    Spontaneous abortion    2    Past Surgical History:  Procedure Laterality Date   BREAST SURGERY     LIPOSUCTION      Social History:   reports that she has never smoked. She has never used smokeless tobacco. She reports current alcohol use. She reports that she does not use drugs.  Allergies  Allergen Reactions   Gluten Meal    Penicillins Swelling    Pt states medication gives her bruises, no SOB or hives/itching. Childhood reaction  Has patient had a PCN reaction causing immediate rash, facial/tongue/throat swelling, SOB or lightheadedness with hypotension: Yes Has patient had a PCN reaction causing severe rash involving mucus membranes or skin necrosis: No Has patient had a PCN reaction that required hospitalization:No Has patient had a PCN reaction occurring within the last 10 years: /No If all of the above answers are NO, then may proceed    Family History  Problem Relation Age of Onset   Hypertension Mother    Cancer Mother    Heart disease Father    Seizures Sister        epilepsy     Prior to Admission medications   Medication Sig Start Date End Date Taking? Authorizing Provider  Cyanocobalamin  (VITAMIN B 12) 500 MCG TABS Take 1 tablet by mouth daily.   Yes [provider]  divalproex  (DEPAKOTE ) 500 MG DR tablet Take 1 tablet (500 mg total) by mouth 2 (two) times daily. 05/14/23  Yes Haviland,  Mliss, MD  hydrochlorothiazide  (HYDRODIURIL ) 25 MG tablet Take 1 tablet (25 mg total) by mouth daily. 05/14/23  Yes Dean Mliss, MD  Magnesium 300 MG CAPS Take 1 capsule by mouth daily at 6 (six) AM.   Yes [provider]  metFORMIN  (GLUCOPHAGE ) 500 MG tablet Take 500 mg by mouth 2 (two) times daily with a meal. 06/06/23 07/06/23 Yes [provider]    Physical Exam: Vitals:   06/19/23 1800 06/19/23 1815 06/19/23 1830 06/19/23 1851  BP: 134/72 (!) 128/91  (!) 149/88 125/81  Pulse: 71 73 68 71  Resp:   18 18  Temp:      TempSrc:      SpO2: 100% 99% 98% 99%    Constitutional: NAD, no pallor or diaphoresis   Eyes: PERTLA, lids and conjunctivae normal ENMT: Mucous membranes are moist. Posterior pharynx clear of any exudate or lesions.   Neck: supple, no masses  Respiratory: no wheezing, no crackles. No accessory muscle use.  Cardiovascular: S1 & S2 heard, regular rate and rhythm. No JVD. Abdomen: No distension, no tenderness, soft. Bowel sounds active.  Musculoskeletal: no clubbing / cyanosis. No joint deformity upper and lower extremities.   Skin: no significant rashes, lesions, ulcers. Warm, dry, well-perfused. Neurologic: Somnolent, wakes to voice but quickly returns to sleep. Moving all extremities.    Labs and Imaging on Admission: I have personally reviewed following labs and imaging studies  CBC: Recent Labs  Lab 06/19/23 1850  WBC 6.1  NEUTROABS 2.6  HGB 13.5  HCT 40.7  MCV 86.8  PLT 175   Basic Metabolic Panel: Recent Labs  Lab 06/19/23 1850  NA 138  K 3.7  CL 102  CO2 25  GLUCOSE 91  BUN 13  CREATININE 0.75  CALCIUM 9.0   GFR: CrCl cannot be calculated (Unknown ideal weight.). Liver Function Tests: No results for input(s): AST, ALT, ALKPHOS, BILITOT, PROT, ALBUMIN in the last 168 hours. No results for input(s): LIPASE, AMYLASE in the last 168 hours. No results for input(s): AMMONIA in the last 168 hours. Coagulation Profile: No results for input(s): INR, PROTIME in the last 168 hours. Cardiac Enzymes: No results for input(s): CKTOTAL, CKMB, CKMBINDEX, TROPONINI in the last 168 hours. BNP (last 3 results) No results for input(s): PROBNP in the last 8760 hours. HbA1C: No results for input(s): HGBA1C in the last 72 hours. CBG: Recent Labs  Lab 06/19/23 1849  GLUCAP 85   Lipid Profile: No results for input(s): CHOL, HDL, LDLCALC, TRIG, CHOLHDL, LDLDIRECT  in the last 72 hours. Thyroid  Function Tests: No results for input(s): TSH, T4TOTAL, FREET4, T3FREE, THYROIDAB in the last 72 hours. Anemia Panel: No results for input(s): VITAMINB12, FOLATE, FERRITIN, TIBC, IRON, RETICCTPCT in the last 72 hours. Urine analysis:    Component Value Date/Time   COLORURINE AMBER (A) 12/14/2022 1752   APPEARANCEUR HAZY (A) 12/14/2022 1752   LABSPEC 1.029 12/14/2022 1752   PHURINE 5.0 12/14/2022 1752   GLUCOSEU NEGATIVE 12/14/2022 1752   HGBUR NEGATIVE 12/14/2022 1752   BILIRUBINUR SMALL (A) 12/14/2022 1752   KETONESUR 5 (A) 12/14/2022 1752   PROTEINUR 30 (A) 12/14/2022 1752   NITRITE NEGATIVE 12/14/2022 1752   LEUKOCYTESUR NEGATIVE 12/14/2022 1752   Sepsis Labs: @LABRCNTIP (procalcitonin:4,lacticidven:4) )No results found for this or any previous visit (from the past 240 hours).   Radiological Exams on Admission: No results found.  EKG: Independently reviewed. Sinus rhythm.   Assessment/Plan   1. Hydrocephalus   - Identified on imaging in [place] , pt  has images on disc    - Fall-precautions, supportive care, follow-up on neurosurgery recommendations   2. Seizures; supratherapeutic valproate level - Valproate level is 133 in ED; pt's husband believes she was taking extra doses of her medication, forgetting that she had already taken it  - Hold Depakote  tonight, repeat level in am   3. Hypertension  - hydrochlorothiazide     DVT prophylaxis: SCDs  Code Status: Full  Level of Care: Level of care: Telemetry Medical Family Communication: Husband at bedside   Disposition Plan:  Patient is from: Home  Anticipated d/c is to: TBD Anticipated d/c date is: TBD based on neurosurgery recommendations  Patient currently: Pending neurosurgery consultation  Consults called: Neurosurgery  Admission status: Observation     Evalene GORMAN Sprinkles, MD [facility]  06/19/2023, 8:10 PM

## 2023-06-19 NOTE — ED Triage Notes (Addendum)
 Pt is coming in for frequent falls that has been occurring since new years, She mentions being on seizure medications. She mentions that when she gets the falling sensation it feels like when your drunk and fall. She fell twice yesterday and that's how often she has been falling. She was Dx with Hydrocephalus with an MRI in [place] , and they said she needs a surgical drain.

## 2023-06-20 ENCOUNTER — Observation Stay (HOSPITAL_COMMUNITY): Payer: Commercial Managed Care - HMO

## 2023-06-20 DIAGNOSIS — G911 Obstructive hydrocephalus: Secondary | ICD-10-CM | POA: Diagnosis not present

## 2023-06-20 LAB — CBC
HCT: 44.5 % (ref 36.0–46.0)
Hemoglobin: 14.2 g/dL (ref 12.0–15.0)
MCH: 28.3 pg (ref 26.0–34.0)
MCHC: 31.9 g/dL (ref 30.0–36.0)
MCV: 88.8 fL (ref 80.0–100.0)
Platelets: 178 10*3/uL (ref 150–400)
RBC: 5.01 MIL/uL (ref 3.87–5.11)
RDW: 15.9 % — ABNORMAL HIGH (ref 11.5–15.5)
WBC: 6.9 10*3/uL (ref 4.0–10.5)
nRBC: 0 % (ref 0.0–0.2)

## 2023-06-20 LAB — COMPREHENSIVE METABOLIC PANEL
ALT: 34 U/L (ref 0–44)
AST: 38 U/L (ref 15–41)
Albumin: 3.3 g/dL — ABNORMAL LOW (ref 3.5–5.0)
Alkaline Phosphatase: 48 U/L (ref 38–126)
Anion gap: 8 (ref 5–15)
BUN: 10 mg/dL (ref 6–20)
CO2: 27 mmol/L (ref 22–32)
Calcium: 8.7 mg/dL — ABNORMAL LOW (ref 8.9–10.3)
Chloride: 103 mmol/L (ref 98–111)
Creatinine, Ser: 0.73 mg/dL (ref 0.44–1.00)
GFR, Estimated: >60 mL/min (ref >60)
Glucose, Bld: 119 mg/dL — ABNORMAL HIGH (ref 70–99)
Potassium: 3.8 mmol/L (ref 3.5–5.1)
Sodium: 138 mmol/L (ref 135–145)
Total Bilirubin: 0.7 mg/dL (ref 0.0–1.2)
Total Protein: 6.8 g/dL (ref 6.5–8.1)

## 2023-06-20 LAB — GLUCOSE, CAPILLARY
Glucose-Capillary: 91 mg/dL (ref 70–99)
Glucose-Capillary: 161 mg/dL — ABNORMAL HIGH (ref 70–99)
Glucose-Capillary: 101 mg/dL — ABNORMAL HIGH (ref 70–99)

## 2023-06-20 LAB — VALPROIC ACID LEVEL: Valproic Acid Lvl: 109 ug/mL — ABNORMAL HIGH (ref 50.0–100.0)

## 2023-06-20 LAB — HEMOGLOBIN A1C
Hgb A1c MFr Bld: 6.8 % — ABNORMAL HIGH (ref 4.8–5.6)
Mean Plasma Glucose: 148.46 mg/dL

## 2023-06-20 LAB — AMMONIA: Ammonia: 29 umol/L (ref 9–35)

## 2023-06-20 LAB — HIV ANTIBODY (ROUTINE TESTING W REFLEX): HIV Screen 4th Generation wRfx: NONREACTIVE

## 2023-06-20 LAB — CBG MONITORING, ED: Glucose-Capillary: 90 mg/dL (ref 70–99)

## 2023-06-20 MED ORDER — HYDRALAZINE HCL 20 MG/ML IJ SOLN: 5.0000 mg | Freq: Four times a day (QID) | INTRAMUSCULAR | Status: DC | PRN

## 2023-06-20 MED ORDER — INSULIN ASPART 100 UNIT/ML IJ SOLN
0.0000 [IU] | Freq: Three times a day (TID) | INTRAMUSCULAR | Status: DC
  Administered 2023-06-20: 2 [IU] via SUBCUTANEOUS
  Administered 2023-06-22 – 2023-06-25 (×5): 1 [IU] via SUBCUTANEOUS
  Administered 2023-06-26: 2 [IU] via SUBCUTANEOUS
  Administered 2023-06-27 – 2023-06-29 (×2): 1 [IU] via SUBCUTANEOUS

## 2023-06-20 NOTE — Progress Notes (Signed)
 Case d/w Dr. Debby. Pt presented to ED yesterday w/ CC of ataxia. PMH seizures, HTN, DM. Reportedly was dx'd w/ Hydrocephalus about a month ago in [place]. Per chart review, patient has nonfocal neurologic exam and is A&O. CTH and MRI need to be uploaded for review. If not possible, I have ordered updated MRI brain w/ and w/o for further evaluation. Call w/ questions/concerns.  Travarus Trudo CAYLIN Alexxia Stankiewicz, PA-C

## 2023-06-20 NOTE — Progress Notes (Addendum)
 PROGRESS NOTE   Alison Collins  FMW:[id number]    DOB: 1970-09-24    DOA: 06/19/2023  PCP: Patient, No Pcp Per   I have briefly reviewed patients previous medical records in [facility].  Chief Complaint  Patient presents with   Frequent falls    Brief Hospital Course:  53 year old married female, Brazilian, Portuguese speaking but does speak English, independent, medical history significant for HTN, seizures (partial seizures of temporal lobe), recent diagnosis of DM 2, recent hospitalization at [facility] [place]  [facility] between 06/05/2023 - 06/06/2023 for altered mental status, acute respiratory failure with hypoxia, fall, diagnosed with COVID infection, A1c 6.9, MRI brain showed acute noncommunicating hydrocephalus with transependymal flow of CSF and there appeared to be 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  Spouse concerned that she may be taking extra doses of her medications due to forgetting that she had already taken them.  Admitted for obstructive hydrocephalus.  Neurosurgery/Dr. Gillie was consulted and input is pending.   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 Gillie consulted overnight of admission.  Input pending.  Patient is n.p.o. but not sure if she will truly have surgery today.  Will need to determine quickly and start diet if no surgery planned for today. PT and OT evaluation  Seizure disorder/supratherapeutic valproate level Valproate level 133 in ED, spouse believes that patient was taking extra doses of her medication due to forgetting that she had already taken it Depakote  held on admission.  Valproate level down to 109 but still supratherapeutic Continue to hold Depakote , plan to discuss with neurology regarding timing of initiation  Addendum: Discussed with neurology.  Agree with checking valproate levels in a.m. and if within therapeutic range then to resume prior home dose and recheck valproate levels 2 to 3 days later. Pharmacy reviewed home med list and no drug to drug interactions that would cause increase in valproate levels.  Essential hypertension Mildly uncontrolled.  Continue HCTZ 25 Mg daily. Added as needed IV hydralazine   Newly diagnosed type II DM Recent A1c 6.9 Good inpatient control on NovoLog  SSI. Resume metformin  as outpatient.   There is no height or weight on file to calculate BMI.   DVT prophylaxis: SCDs Start: 06/19/23 2009     Code Status: Full Code:  Family Communication: Spouse via phone. Disposition:  Status is: Observation The patient remains OBS appropriate and will d/c before 2 midnights.  For now okay to remain as observation.  Need to follow neurosurgery input and if surgery planned then would be appropriate to change to inpatient.   Consultants:   Neurosurgery/Dr. Gillie  Procedures:     Antimicrobials:      Subjective:  Seen this morning while she was still in the ED.  Reported mild 4/10 frontal headache without nausea, vomiting or visual disturbances.   Asking if she can go home.  States that she cannot sleep in the hospital.  Indicates that all her complaints started about the time she got married approximately 4 weeks ago.  Objective:   Vitals:   06/20/23 0628 06/20/23 0730 06/20/23 0732 06/20/23 0756  BP: 117/80 (!) 167/85    Pulse: (!) 24 70    Resp: 14 (!) 22    Temp: (!) 97.3 F (36.3 C)  98.5 F (36.9 C) 97.9 F (36.6 C)  TempSrc: Oral  Oral Oral  SpO2: 97% 99%      General exam: Young female, moderately built and obese lying comfortably propped up in bed without distress.  Patient's female RN attending to her at bedside as well. Respiratory system: Clear to auscultation. Respiratory effort normal. Cardiovascular system: S1 & S2 heard, RRR. No JVD, murmurs, rubs, gallops or clicks. No pedal edema.  Telemetry personally reviewed: Sinus rhythm. Gastrointestinal system: Abdomen is nondistended, soft and nontender. No organomegaly or masses felt. Normal bowel sounds heard. Central nervous system: Alert and oriented. No focal neurological deficits. Extremities: Symmetric 5 x 5 power. Skin: No rashes, lesions or ulcers Psychiatry: Judgement and insight appear normal. Mood & affect appropriate.     Data Reviewed:   I have personally reviewed following labs and imaging studies   CBC: Recent Labs  Lab 06/19/23 1850 06/20/23 0350  WBC 6.1 6.9  NEUTROABS 2.6  --   HGB 13.5 14.2  HCT 40.7 44.5  MCV 86.8 88.8  PLT 175 178    Basic Metabolic Panel: Recent Labs  Lab 06/19/23 1850 06/20/23 0350  NA 138 138  K 3.7 3.8  CL 102 103  CO2 25 27  GLUCOSE 91 119*  BUN 13 10  CREATININE 0.75 0.73  CALCIUM 9.0 8.7*    Liver Function Tests: Recent Labs  Lab 06/20/23 0350  AST 38  ALT 34  ALKPHOS 48  BILITOT 0.7  PROT 6.8  ALBUMIN 3.3*    CBG: Recent Labs  Lab 06/19/23 1849 06/20/23 0014  GLUCAP 85 90    Microbiology Studies:  No results found for this or any previous visit (from the past 240  hours).  Radiology Studies:  No results found.  Scheduled Meds:    hydrochlorothiazide   25 mg Oral Daily   influenza vaccine adjuvanted  0.5 mL Intramuscular Tomorrow-1000   insulin  aspart  0-5 Units Subcutaneous QHS   insulin  aspart  0-6 Units Subcutaneous TID WC   pneumococcal 20-valent conjugate vaccine  0.5 mL Intramuscular Tomorrow-1000   sodium chloride  flush  3 mL Intravenous Q12H    Continuous Infusions:     LOS: 0 days     Trenda Mar, MD,  FACP, [facility], [facility], [facility]   [facility] [place]      To contact the attending provider between 7A-7P or the covering provider during after hours 7P-7A, please log into the web site [email or web address] and access using universal [place] password for that web site. If you do not have the password, please call the hospital operator.  06/20/2023, 8:11 AM

## 2023-06-20 NOTE — Progress Notes (Addendum)
 Chaplain did a routine spiritual visit to Pt in this unit assigned to this Chaplain. Pt expressed her frustration for recent health changes and how impactful these changes have been for her life. Pt is Portuguese-speaker and was grateful to be able to communicate with the Chaplain in her own native language. Pt's husband was at bedside and showing support for her. They got married very recently and he is bringing her support and emotional strength. Pt shared with Chaplain how her challenges in her health are many times taking her to have radical or dramatic thoughts, which Chaplain encourage Pt to share, if they persist. Pt asked Chaplain to pray for her situation. Pt remembered her mother, Clovis, and how much she misses her advice and presence with her. Pt is also concerned about her difficult relationship with her daughter.  Eric Fortune Chaplain Resident

## 2023-06-20 NOTE — TOC Initial Note (Signed)
 Transition of Care [facility]) - Initial/Assessment Note    Patient Details  Name: Alison Collins MRN: [id number] Date of Birth: [birth year]  Transition of Care [facility]) CM/SW Contact:    Andrez JULIANNA George, RN Phone Number: 06/20/2023, 12:02 PM  Clinical Narrative:                  Pt is from home with her spouse. Spouse works out of town 5 days a week. Spouse working on some friends that may be able to check on her after d/c.  No DME.  Spouse provides needed transportation. Spouse is also seeing if some friends could provide transportation if needed.  Pt manages her own medications. Spouse says she over took her depakote  at home. Spouse plans to set up a pill box for her after d/c. CM also discussed using alarms on her phone to remind her when to take her meds.  Awaiting therapy and neurosurgery evals.  TOC following.   Expected Discharge Plan: Home/Self Care Barriers to Discharge: Continued Medical Work up   Patient Goals and CMS Choice            Expected Discharge Plan and Services   Discharge Planning Services: CM Consult   Living arrangements for the past 2 months: Apartment                                      Prior Living Arrangements/Services Living arrangements for the past 2 months: Apartment Lives with:: Spouse Patient language and need for interpreter reviewed:: Yes Do you feel safe going back to the place where you live?: Yes        Care giver support system in place?: No (comment)   Criminal Activity/Legal Involvement Pertinent to Current Situation/Hospitalization: No - Comment as needed  Activities of Daily Living   ADL Screening (condition at time of admission) Independently performs ADLs?: No Does the patient have a NEW difficulty with bathing/dressing/toileting/self-feeding that is expected to last >3 days?: No (needs assistance) Does the patient have a NEW difficulty with getting in/out of bed, walking, or climbing stairs that is expected to last  >3 days?: No (needs assistance) Does the patient have a NEW difficulty with communication that is expected to last >3 days?: No Is the patient deaf or have difficulty hearing?: No Does the patient have difficulty seeing, even when wearing glasses/contacts?: No Does the patient have difficulty concentrating, remembering, or making decisions?: Yes  Permission Sought/Granted                  Emotional Assessment Appearance:: Appears stated age   Affect (typically observed): Quiet Orientation: : Oriented to Self, Oriented to Place, Oriented to Situation   Psych Involvement: No (comment)  Admission diagnosis:  Hydrocephalus (HCC) [G91.9] Hydrocephalus, unspecified type [facility]) [G91.9] Patient Active Problem List   Diagnosis Date Noted   Hydrocephalus (HCC) 06/19/2023   Non-insulin  dependent type 2 diabetes mellitus (HCC) 06/19/2023   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 [place] [address] Phone:  [phone] Fax: [phone]  [id number] [place] - [address] AT [facility] [address] [place] [address] Phone: [phone] Fax: [phone]  [facility] [address] [place] [address] Phone: [phone] Fax: [phone]     Social Drivers of Health (SDOH) Social History: SDOH Screenings   Food Insecurity: No Food Insecurity (06/19/2023)  Housing: Low Risk  (06/19/2023)  Transportation Needs: Unmet Transportation Needs (06/19/2023)   Utilities: Not At Risk (06/19/2023)  Tobacco Use: Low Risk  (06/19/2023)   SDOH Interventions:     Readmission Risk Interventions     No data to display

## 2023-06-21 DIAGNOSIS — R569 Unspecified convulsions: Secondary | ICD-10-CM

## 2023-06-21 DIAGNOSIS — G911 Obstructive hydrocephalus: Secondary | ICD-10-CM | POA: Diagnosis not present

## 2023-06-21 LAB — GLUCOSE, CAPILLARY
Glucose-Capillary: 73 mg/dL (ref 70–99)
Glucose-Capillary: 106 mg/dL — ABNORMAL HIGH (ref 70–99)
Glucose-Capillary: 120 mg/dL — ABNORMAL HIGH (ref 70–99)
Glucose-Capillary: 120 mg/dL — ABNORMAL HIGH (ref 70–99)

## 2023-06-21 LAB — VALPROIC ACID LEVEL: Valproic Acid Lvl: 45 ug/mL — ABNORMAL LOW (ref 50.0–100.0)

## 2023-06-21 MED ORDER — GADOBUTROL 1 MMOL/ML IV SOLN
9.0000 mL | Freq: Once | INTRAVENOUS | Status: AC | PRN
  Administered 2023-06-21: 9 mL via INTRAVENOUS

## 2023-06-21 MED ORDER — DIVALPROEX SODIUM 500 MG PO DR TAB
500.0000 mg | DELAYED_RELEASE_TABLET | Freq: Two times a day (BID) | ORAL | Status: DC
  Administered 2023-06-21 – 2023-06-29 (×17): 500 mg via ORAL
  Filled 2023-06-21 (×7): qty 2
  Filled 2023-06-21: qty 1
  Filled 2023-06-21 (×5): qty 2
  Filled 2023-06-21: qty 1
  Filled 2023-06-21 (×2): qty 2
  Filled 2023-06-21: qty 1
  Filled 2023-06-21: qty 2

## 2023-06-21 MED ORDER — VITAMIN B-12 1000 MCG PO TABS
1000.0000 ug | ORAL_TABLET | Freq: Every day | ORAL | Status: DC
  Administered 2023-06-21 – 2023-06-29 (×9): 1000 ug via ORAL
  Filled 2023-06-21 (×9): qty 1

## 2023-06-21 NOTE — Plan of Care (Signed)
  Problem: Fluid Volume: Goal: Ability to maintain a balanced intake and output will improve Outcome: Progressing   Problem: Metabolic: Goal: Ability to maintain appropriate glucose levels will improve Outcome: Progressing   Problem: Coping: Goal: Level of anxiety will decrease Outcome: Progressing   Problem: Safety: Goal: Ability to remain free from injury will improve Outcome: Progressing

## 2023-06-21 NOTE — Progress Notes (Signed)
 PROGRESS NOTE   Alison Collins  FMW:[id number]    DOB: 1971-03-16    DOA: 06/19/2023  PCP: Patient, No Pcp Per   I have briefly reviewed patients previous medical records in [facility].  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  Spouse concerned that she may be taking extra doses of her medications due to forgetting that she had already taken them.  Admitted for obstructive hydrocephalus.  Neurosurgery consulted, they obtained a repeat MRI brain with and without contrast, follow-up pending regarding further plans.   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 PT and OT evaluation.  Outpatient PT recommended thus far. MRI brain with and without contrast 1/13: Slight worsening of chronic hydrocephalus of the lateral and third ventricles compared to 12/15/2022. 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  Seizure disorder/supratherapeutic valproate level Valproate level 133 in ED, spouse believes that patient was taking extra doses of her medication due to forgetting that she had already taken it Depakote  was briefly held, levels were closely monitored.  Valproate level down to 45 on 1/14. As discussed with neurology, no need of loading dose, continue prior home maintenance dose.  Resumed valproate. Will repeat valproate level in 48 to 72 hours if here or can be done as outpatient with clear instructions that patient should not take more than the advised dose of valproate and spouse can supervise medications. Pharmacy reviewed home med list and no drug to drug interactions that would cause increase in valproate levels.  Essential hypertension Mildly uncontrolled.  Continue HCTZ 25 Mg daily. Added as needed IV hydralazine  Better controlled today.  Newly diagnosed type II DM A1c 6.8. Good inpatient control on NovoLog  SSI. Resume metformin  as outpatient.  Body mass index is 35.42 kg/m./Obesity Outpatient follow-up.   DVT prophylaxis: SCDs Start: 06/19/23 2009     Code Status: Full Code:  Family Communication: Spouse via phone. Disposition:  Status is: Observation The patient remains OBS appropriate and will d/c  before 2 midnights.  For now okay to remain as observation.  Need to follow neurosurgery input and if surgery planned then would be appropriate to change to inpatient.   Consultants:   Neurosurgery  Procedures:     Antimicrobials:      Subjective:  Patient states that after the MRI brain, she feels better.  Less unsteady.  No headache, nausea, vomiting or visual symptoms reported.  Patient and spouse both eagerly waiting to hear from neurosurgery regarding further plans.  Per nursing, at around 1 AM last night, patient attempted to leave AMA.  Counseled both patient and spouse that it is very important that she remain in the hospital to complete evaluation and appropriate management.  If she leaves prematurely, she was warned of the dangers of decline and even death.  They verbalized understanding.  Patient has capacity to make medical decisions including disposition.  Objective:   Vitals:   06/21/23 0339 06/21/23 0752 06/21/23 0900 06/21/23 1148  BP: (!) 156/85 (!) 143/102 (!) 156/85 138/84  Pulse: 90 75  80  Resp: 19 17  18   Temp: 97.8 F (36.6 C) 97.8 F (36.6 C)  97.7 F (36.5 C)  TempSrc:  Oral  Oral  SpO2: 98% 95%  94%  Height:  5' 3 (1.6 m)      General exam: 53 female, moderately built and obese lying comfortably propped up in bed without distress.  Appears to be in good spirits. Respiratory system: Clear to auscultation. Respiratory effort normal. Cardiovascular system: S1 & S2 heard, RRR. No JVD, murmurs, rubs, gallops or clicks. No pedal edema.  Off of telemetry. Gastrointestinal system: Abdomen is nondistended, soft and nontender. No organomegaly or masses felt. Normal bowel sounds heard. Central nervous system: Alert and oriented. No focal neurological deficits. Extremities: Symmetric 5 x 5 power. Skin: No rashes, lesions or ulcers Psychiatry: Judgement and insight may be somewhat impaired. Mood & affect pleasant and appropriate.     Data Reviewed:   I  have personally reviewed following labs and imaging studies   CBC: Recent Labs  Lab 06/19/23 1850 06/20/23 0350  WBC 6.1 6.9  NEUTROABS 2.6  --   HGB 13.5 14.2  HCT 40.7 44.5  MCV 86.8 88.8  PLT 175 178    Basic Metabolic Panel: Recent Labs  Lab 06/19/23 1850 06/20/23 0350  NA 138 138  K 3.7 3.8  CL 102 103  CO2 25 27  GLUCOSE 91 119*  BUN 13 10  CREATININE 0.75 0.73  CALCIUM 9.0 8.7*    Liver Function Tests: Recent Labs  Lab 06/20/23 0350  AST 38  ALT 34  ALKPHOS 48  BILITOT 0.7  PROT 6.8  ALBUMIN 3.3*    CBG: Recent Labs  Lab 06/20/23 2207 06/21/23 0629 06/21/23 1151  GLUCAP 101* 73 106*    Microbiology Studies:  No results found for this or any previous visit (from the past 240 hours).  Radiology Studies:  MR BRAIN W WO CONTRAST Result Date: 06/21/2023 CLINICAL DATA:  Hydrocephalus EXAM: MRI HEAD WITHOUT AND WITH CONTRAST TECHNIQUE: Multiplanar, multiecho pulse sequences of the brain and surrounding structures were obtained without and with intravenous contrast. CONTRAST:  9mL GADAVIST  GADOBUTROL  1 MMOL/ML IV SOLN COMPARISON:  12/15/2022 head CT FINDINGS: Brain: No acute infarct, mass effect or extra-axial collection. No acute or chronic hemorrhage. Hydrocephalus of the lateral and third ventricles is slightly worsened compared to 12/15/2022. The fourth ventricle is normal. Expanded and partially empty sella turcica. There is no abnormal  contrast enhancement. There is multifocal hyperintense T2-weighted signal within the periventricular white matter. Vascular: Normal flow voids. Skull and upper cervical spine: Normal calvarium and skull base. Visualized upper cervical spine and soft tissues are normal. Sinuses/Orbits:No paranasal sinus fluid levels or advanced mucosal thickening. No mastoid or middle ear effusion. Normal orbits. IMPRESSION: 1. 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 2. Expanded and partially empty sella turcica, which can be seen in the setting of idiopathic intracranial hypertension. Electronically Signed   By: Franky Stanford M.D.   On: 06/21/2023 03:04    Scheduled Meds:    cyanocobalamin   1,000 mcg Oral Daily   divalproex   500 mg Oral BID   hydrochlorothiazide   25 mg Oral Daily   insulin  aspart  0-9 Units Subcutaneous TID WC   sodium chloride  flush  3 mL Intravenous Q12H    Continuous Infusions:     LOS: 0 days     Trenda Mar, MD,  FACP, [facility], [facility], [facility]   [facility] [place]      To contact the attending provider between 7A-7P or the covering provider during after hours 7P-7A, please log into the web site [email or web address] and access using universal [place] password for that web site. If you do not have the password, please call the hospital operator.  06/21/2023, 11:59 AM

## 2023-06-21 NOTE — Evaluation (Signed)
 Occupational Therapy Evaluation Patient Details Name: Alison Collins MRN: [id number] DOB: September 09, 1970 Today's Date: 06/21/2023   History of Present Illness 53 y.o. female who presents 06/19/23 for evaluation of confusion, falls, and hydrocephalus. Hydrochephalus diagnosed 05/2023 in [place] .  PMH significant for hypertension, seizures, and recent diagnosis of diabetes mellitus   Clinical Impression   Alison Collins was evaluated s/p the above admission list. She is typically indep at baseline, however she has had a recent hospitalization with balance deficits and falls. Upon evaluation, pt demonstrated mod I-indep ability to complete mobility and ADLs. Pt does not require further acute, or follow up OT services. Recommend discharge back to pt's environment with assist as needed. OT to sign off with appreciation of order, please re-consult if needed.          If plan is discharge home, recommend the following: Assistance with cooking/housework;Direct supervision/assist for medications management;Assist for transportation;Direct supervision/assist for financial management    Functional Status Assessment  Patient has had a recent decline in their functional status and demonstrates the ability to make significant improvements in function in a reasonable and predictable amount of time.  Equipment Recommendations  None recommended by OT       Precautions / Restrictions Precautions Precautions: Fall Restrictions Weight Bearing Restrictions Per Provider Order: No      Mobility Bed Mobility Overal bed mobility: Independent                  Transfers Overall transfer level: Needs assistance Equipment used: None Transfers: Sit to/from Stand Sit to Stand: Modified independent (Device/Increase time)           General transfer comment: OOB ambulating in room on arrival, pt initially using RW, noted to move well without it. Educated on RW use when feeling unsteady      Balance Overall  balance assessment: Needs assistance, History of Falls Sitting-balance support: No upper extremity supported, Feet supported Sitting balance-Leahy Scale: Normal     Standing balance support: During functional activity, No upper extremity supported Standing balance-Leahy Scale: Good                             ADL either performed or assessed with clinical judgement   ADL Overall ADL's : Needs assistance/impaired Eating/Feeding: Independent   Grooming: Wash/dry hands;Oral care;Independent;Standing   Upper Body Bathing: Independent;Standing   Lower Body Bathing: Independent;Sit to/from stand   Upper Body Dressing : Independent;Sitting;Standing   Lower Body Dressing: Sit to/from stand;Independent   Toilet Transfer: Independent;Standard Surveyor, Quantity and Hygiene: Independent;Sit to/from stand   Tub/ Shower Transfer: Modified independent;Grab bars   Functional mobility during ADLs: Independent;Standard walker General ADL Comments: Pt generally ambulates without use of AD within the home. Pt educated on using RW when feeling unsteady to reduce fall risk     Vision Baseline Vision/History: 0 No visual deficits Vision Assessment?: No apparent visual deficits     Perception Perception: Within Functional Limits       Praxis Praxis: [facility]        Extremity/Trunk Assessment Upper Extremity Assessment Upper Extremity Assessment: Overall WFL for tasks assessed   Lower Extremity Assessment Lower Extremity Assessment: Defer to PT evaluation   Cervical / Trunk Assessment Cervical / Trunk Assessment: Normal   Communication Communication Communication: No apparent difficulties   Cognition Arousal: Alert Behavior During Therapy: WFL for tasks assessed/performed Overall Cognitive Status: Within Functional Limits for tasks assessed  General Comments  VSS, husband present            Home Living Family/patient expects  to be discharged to:: Private residence Living Arrangements: Spouse/significant other Available Help at Discharge: Family;Available PRN/intermittently;Friend(s) Type of Home: Apartment Home Access: Level entry     Home Layout: One level     Bathroom Shower/Tub: Chief Strategy Officer: Standard     Home Equipment: None   Additional Comments: Husband working on providing pt with more supervision when he's at work      Prior Functioning/Environment Prior Level of Function : Independent/Modified Independent             Mobility Comments: recent fall due to imbalance ADLs Comments: Independent ADLs        OT Problem List: Decreased activity tolerance;Impaired balance (sitting and/or standing)         OT Goals(Current goals can be found in the care plan section) Acute Rehab OT Goals Patient Stated Goal: home OT Goal Formulation: With patient Time For Goal Achievement: 06/21/23 Potential to Achieve Goals: Good ADL Goals Pt Will Perform Grooming: Independently;standing Pt Will Perform Lower Body Bathing: Independently;sit to/from stand Pt Will Transfer to Toilet: Independently Pt Will Perform Toileting - Clothing Manipulation and hygiene: Independently Additional ADL Goal #1: Pt will complete medication managment Independently using a pillbox   AM-PAC OT 6 Clicks Daily Activity     Outcome Measure Help from another person eating meals?: None Help from another person taking care of personal grooming?: None Help from another person toileting, which includes using toliet, bedpan, or urinal?: None Help from another person bathing (including washing, rinsing, drying)?: None Help from another person to put on and taking off regular upper body clothing?: None Help from another person to put on and taking off regular lower body clothing?: None 6 Click Score: 24   End of Session Equipment Utilized During Treatment: Rolling walker (2 wheels) Nurse Communication:  Mobility status  Activity Tolerance: Patient tolerated treatment well Patient left: Other (comment) (standing at the sink)  OT Visit Diagnosis: Other abnormalities of gait and mobility (R26.89);History of falling (Z91.81)                Time: 1456-1510 OT Time Calculation (min): 14 min Charges:  OT General Charges $OT Visit: 1 Visit OT Evaluation $OT Eval Low Complexity: 1 Low  Lucie Kendall, OTR/L Acute Rehabilitation Services Office [phone] Secure Chat Communication Preferred   Lucie JONETTA Kendall 06/21/2023, 3:35 PM

## 2023-06-21 NOTE — Evaluation (Signed)
 Physical Therapy Evaluation Patient Details Name: Alison Collins MRN: [id number] DOB: 1970/11/04 Today's Date: 06/21/2023  History of Present Illness  53 y.o. female who presents 06/19/23 for evaluation of confusion, falls, and hydrocephalus. Hydrochephalus diagnosed 05/2023 in [place] .  PMH significant for hypertension, seizures, and recent diagnosis of diabetes mellitus  Clinical Impression   Pt admitted secondary to problem above with deficits below. PTA patient was recently diagnosed with hydrocephalus with +changes in gait and at least one fall.  Pt currently requires CGA for ambulation with +dirft and min assist for stairs due to imbalance while descending (even with holding rail). Pt denies dizziness, just lose my balance. Due to recent fall, recommend pt have 24/7 assistance and husband is trying to arrange with pt's daughter and friends.  Anticipate patient will benefit from PT to address problems listed below.Will continue to follow acutely to maximize functional mobility independence and safety.  Pt can benefit from OPPT to address balance and gait.          If plan is discharge home, recommend the following: A little help with walking and/or transfers;Assistance with cooking/housework;Assist for transportation;Help with stairs or ramp for entrance   Can travel by private vehicle        Equipment Recommendations None recommended by PT  Recommendations for Other Services       Functional Status Assessment Patient has had a recent decline in their functional status and demonstrates the ability to make significant improvements in function in a reasonable and predictable amount of time.     Precautions / Restrictions Precautions Precautions: Fall      Mobility  Bed Mobility Overal bed mobility: Independent             General bed mobility comments: including manipulating linens    Transfers Overall transfer level: Needs assistance Equipment used:  None Transfers: Sit to/from Stand Sit to Stand: Contact guard assist           General transfer comment: guarding for safety due to h/o imbalance and fall    Ambulation/Gait Ambulation/Gait assistance: Contact guard assist Gait Distance (Feet): 200 Feet Assistive device: None Gait Pattern/deviations: Step-through pattern, Decreased stride length   Gait velocity interpretation: 1.31 - 2.62 ft/sec, indicative of limited community ambulator   General Gait Details: self-selects very slow pace; able to incr with cues but reverts back to slower pace while talking  Stairs Stairs: Yes Stairs assistance: Min assist Stair Management: One rail Left, Alternating pattern, Forwards Number of Stairs: 2 General stair comments: descending with +LOB to her left required min assist to recover (even with using rail)  Wheelchair Mobility     Tilt Bed    Modified Rankin (Stroke Patients Only)       Balance Overall balance assessment: Needs assistance, History of Falls Sitting-balance support: No upper extremity supported, Feet supported Sitting balance-Leahy Scale: Good     Standing balance support: No upper extremity supported Standing balance-Leahy Scale: Fair                   Standardized Balance Assessment Standardized Balance Assessment : Berg Balance Test, Dynamic Gait Index Berg Balance Test Sit to Stand: Able to stand without using hands and stabilize independently Standing Unsupported: Able to stand 2 minutes with supervision Sitting with Back Unsupported but Feet Supported on Floor or Stool: Able to sit safely and securely 2 minutes Stand to Sit: Sits safely with minimal use of hands Transfers: Able to transfer with verbal cueing and /or supervision Standing  Unsupported with Eyes Closed: Able to stand 10 seconds with supervision Standing Ubsupported with Feet Together: Needs help to attain position but able to stand for 30 seconds with feet together Standing  Unsupported, One Foot in Front: Able to take small step independently and hold 30 seconds Standing on One Leg: Able to lift leg independently and hold equal to or more than 3 seconds Dynamic Gait Index Level Surface: Mild Impairment Change in Gait Speed: Normal Gait with Horizontal Head Turns: Mild Impairment Gait with Vertical Head Turns: Mild Impairment Gait and Pivot Turn: Mild Impairment Step Over Obstacle: Mild Impairment Step Around Obstacles: Normal Steps: Moderate Impairment Total Score: 17       Pertinent Vitals/Pain Pain Assessment Pain Assessment: No/denies pain    Home Living Family/patient expects to be discharged to:: Private residence Living Arrangements: Spouse/significant other Available Help at Discharge: Family;Available PRN/intermittently;Friend(s) (husband works out-of-town 5 days week) Type of Home: Apartment Home Access: Level entry       Home Layout: One level Home Equipment: None      Prior Function Prior Level of Function : Independent/Modified Independent             Mobility Comments: recent fall due to imbalance       Extremity/Trunk Assessment   Upper Extremity Assessment Upper Extremity Assessment: Defer to OT evaluation    Lower Extremity Assessment Lower Extremity Assessment: Overall WFL for tasks assessed    Cervical / Trunk Assessment Cervical / Trunk Assessment: Other exceptions Cervical / Trunk Exceptions: overweight  Communication   Communication Communication: Difficulty communicating thoughts/reduced clarity of speech;Other (comment) (Portuguese primary language; some word-finding issues) Cueing Techniques: Verbal cues;Gestural cues  Cognition Arousal: Alert Behavior During Therapy: WFL for tasks assessed/performed                                   General Comments: decr awareness of deficits and wants to go home        General Comments General comments (skin integrity, edema, etc.): Husband  present    Exercises     Assessment/Plan    PT Assessment Patient needs continued PT services  PT Problem List Decreased balance;Decreased mobility;Decreased knowledge of use of DME;Decreased safety awareness;Decreased knowledge of precautions       PT Treatment Interventions DME instruction;Gait training;Stair training;Functional mobility training;Therapeutic activities;Therapeutic exercise;Balance training;Neuromuscular re-education;Cognitive remediation;Patient/family education    PT Goals (Current goals can be found in the Care Plan section)  Acute Rehab PT Goals Patient Stated Goal: go home today PT Goal Formulation: With patient Time For Goal Achievement: 07/05/23 Potential to Achieve Goals: Good    Frequency Min 1X/week     Co-evaluation               AM-PAC PT 6 Clicks Mobility  Outcome Measure Help needed turning from your back to your side while in a flat bed without using bedrails?: None Help needed moving from lying on your back to sitting on the side of a flat bed without using bedrails?: None Help needed moving to and from a bed to a chair (including a wheelchair)?: A Little Help needed standing up from a chair using your arms (e.g., wheelchair or bedside chair)?: A Little Help needed to walk in hospital room?: A Little Help needed climbing 3-5 steps with a railing? : A Little 6 Click Score: 20    End of Session Equipment Utilized During Treatment: Gait belt Activity  Tolerance: Patient tolerated treatment well Patient left: in bed;with call bell/phone within reach;with bed alarm set;with family/visitor present Nurse Communication: Mobility status PT Visit Diagnosis: Unsteadiness on feet (R26.81);Other abnormalities of gait and mobility (R26.89);History of falling (Z91.81)    Time: 1012-1039 PT Time Calculation (min) (ACUTE ONLY): 27 min   Charges:   PT Evaluation $PT Eval Low Complexity: 1 Low PT Treatments $Gait Training: 8-22 mins PT  General Charges $$ ACUTE PT VISIT: 1 Visit          Macario RAMAN, PT Acute Rehabilitation Services  Office [phone]   Macario SHAUNNA Soja 06/21/2023, 10:54 AM

## 2023-06-22 ENCOUNTER — Observation Stay (HOSPITAL_COMMUNITY): Payer: Commercial Managed Care - HMO

## 2023-06-22 DIAGNOSIS — G911 Obstructive hydrocephalus: Secondary | ICD-10-CM | POA: Diagnosis not present

## 2023-06-22 LAB — GLUCOSE, CAPILLARY
Glucose-Capillary: 102 mg/dL — ABNORMAL HIGH (ref 70–99)
Glucose-Capillary: 138 mg/dL — ABNORMAL HIGH (ref 70–99)
Glucose-Capillary: 118 mg/dL — ABNORMAL HIGH (ref 70–99)
Glucose-Capillary: 153 mg/dL — ABNORMAL HIGH (ref 70–99)

## 2023-06-22 MED ORDER — LISINOPRIL 20 MG PO TABS: 20.0000 mg | ORAL_TABLET | Freq: Every day | ORAL | 0 refills | Status: DC

## 2023-06-22 NOTE — Discharge Summary (Signed)
 Physician Discharge Summary  Alison Collins ZOX:[id number] DOB: 05/09/1971 DOA: 06/19/2023  PCP: Patient, No Pcp Per  Admit date: 06/19/2023 Discharge date: 06/22/2023    Admitted From: Home Disposition: Home  Recommendations for Outpatient Follow-up:  Follow up with PCP in 1-2 weeks Please obtain BMP/CBC in one week Follow-up with neurosurgery for possible VP shunt or ETV Please follow up with your PCP on the following pending results: Unresulted Labs (From admission, onward)    None         Home Health: None Equipment/Devices: None  Discharge Condition: Stable CODE STATUS: Full code Diet recommendation: Diabetic  Subjective: Seen and examined.  My first time seeing this patient and when I entered the room, patient said " doctor, please have a heart with me and listen to me, I want to go home".  She was very emotional.  I had a lengthy talk with her.  Explained to her why she was hospitalized and that she has been waiting for neurosurgery to see her and provide recommendations and that once they see her and cleared her for discharge, she will be discharged today.  She was very happy about that.  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  Spouse concerned that she may be taking extra doses of her medications due to forgetting that she had already taken them.  Admitted for obstructive hydrocephalus.  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  She was seen by neurosurgery and they cleared her for discharge with instructions to follow-up outpatient for possible VP shunt or ETV.  They have ordered CT head with this still the protocol prior to discharge.  Patient was seen by PT OT and outpatient PT has been recommended and ordered for her.  Patient is eager to go home.   Seizure disorder/supratherapeutic valproate level Valproate level 133 in ED, spouse believes that patient was taking extra doses of her medication due to forgetting that she had already taken it Depakote  was briefly held, levels were closely monitored.  Valproate level down to 45 on 1/14. As discussed with neurology, no need of loading dose, continue prior home maintenance dose.  Resumed valproate. Will repeat valproate level in 48 to 72 hours if here or can be done as outpatient with clear instructions that patient should not take more than the advised dose of valproate and spouse can supervise medications. Pharmacy reviewed home med list and no drug to drug interactions that would cause increase in valproate levels.   Essential hypertension Mildly uncontrolled.  Continue HCTZ 25 Mg daily.  Added lisinopril  20 mg p.o. daily which will help in prevention of diabetic nephropathy as well.  Renal function is normal  at the moment.   Newly diagnosed type II DM A1c 6.8. Good inpatient control on NovoLog  SSI. Resume metformin  as outpatient.   Body mass index is 35.42 kg/m./Obesity Weight loss and diet  modification counseled.  Discharge plan was discussed with patient and/or family member/daughter and they verbalized understanding and agreed with it.  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 Childhood reaction  Has patient had a PCN reaction causing immediate rash, facial/tongue/throat swelling, SOB or lightheadedness with hypotension: Yes Has patient had a PCN reaction causing severe rash involving mucus membranes or skin necrosis: No Has patient had a PCN reaction that required hospitalization:No Has patient had a PCN reaction occurring within the last 10 years: /No If all of the above answers are "NO", then may proceed        Medication List     TAKE these medications    divalproex  500 MG DR tablet Commonly known as: Depakote  Take 1 tablet (500 mg total) by mouth 2 (two) times daily.   hydrochlorothiazide  25 MG tablet Commonly known as: HYDRODIURIL  Take 1 tablet (25 mg total) by mouth daily.   lisinopril  20 MG tablet Commonly known as: ZESTRIL  Take 1 tablet (20 mg total) by mouth daily.   Magnesium 300 MG Caps Take 1 capsule by mouth daily at 6 (six) AM.   metFORMIN  500 MG tablet Commonly known as: GLUCOPHAGE  Take 500 mg by mouth 2 (two) times daily with a meal.   Vitamin B 12 500 MCG Tabs Take 1 tablet by mouth daily.        Follow-up Information     [facility]. Call.   Why: PLEASE call [facility] they will work with yoou call to obtain and primary care doctor. THey have a pharmacy and have finacial counselling Contact information: [address] [employer] [place]  [address] [phone]        [place] Medicaid Follow up.   Why: Call to apply for medicaid or go down to Social services office Contact information: [phone]        PCP Follow up in 1 week(s).                 Allergies  Allergen Reactions   Gluten Meal    Penicillins Swelling    Pt states medication gives her bruises, no SOB or hives/itching. Childhood reaction  Has patient had a PCN reaction causing immediate rash, facial/tongue/throat swelling, SOB or lightheadedness with hypotension: Yes Has patient had a PCN reaction causing severe rash involving mucus membranes or skin necrosis: No Has patient had a PCN reaction that required hospitalization:No Has patient had a PCN reaction occurring within the last 10 years: /No If all of the above answers are "NO", then may proceed    Consultations: Neurosurgery   Procedures/Studies: MR BRAIN W WO CONTRAST Result Date: 06/21/2023 CLINICAL DATA:  Hydrocephalus EXAM: MRI HEAD WITHOUT AND WITH CONTRAST TECHNIQUE: Multiplanar, multiecho pulse sequences of the brain and surrounding structures were obtained without and with intravenous contrast. CONTRAST:  9mL GADAVIST  GADOBUTROL  1 MMOL/ML IV SOLN COMPARISON:  12/15/2022  head CT FINDINGS: Brain: No acute infarct, mass effect or extra-axial collection. No acute or chronic hemorrhage. Hydrocephalus of the lateral and third ventricles is slightly worsened compared to 12/15/2022. The fourth ventricle is normal. Expanded and partially empty sella turcica. There is no abnormal contrast enhancement. There is multifocal hyperintense T2-weighted signal within the periventricular white matter. Vascular: Normal flow voids. Skull and upper cervical spine: Normal calvarium and skull base. Visualized upper cervical spine and soft tissues are normal. Sinuses/Orbits:No paranasal sinus fluid levels or advanced mucosal thickening. No mastoid or middle ear effusion. Normal  orbits. IMPRESSION: 1. 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 2. Expanded and partially empty sella turcica, which can be seen in the setting of idiopathic intracranial hypertension. Electronically Signed   By: Juanetta Nordmann M.D.   On: 06/21/2023 03:04     Discharge Exam: Vitals:   06/22/23 0418 06/22/23 0731  BP: (!) 163/96 (!) 173/94  Pulse: 93 98  Resp: 16 18  Temp: 98.8 F (37.1 C) 99.1 F (37.3 C)  SpO2: 100% 98%   Vitals:   06/21/23 1955 06/22/23 0028 06/22/23 0418 06/22/23 0731  BP: (!) 153/92 135/82 (!) 163/96 (!) 173/94  Pulse: 80 90 93 98  Resp: 17 19 16 18   Temp: 98.1 F (36.7 C) 98.3 F (36.8 C) 98.8 F (37.1 C) 99.1 F (37.3 C)  TempSrc: Oral Oral Oral Oral  SpO2: 100% 98% 100% 98%  Height:        General: Pt is alert, awake, not in acute distress Cardiovascular: RRR, S1/S2 +, no rubs, no gallops Respiratory: CTA bilaterally, no wheezing, no rhonchi Abdominal: Soft, NT, ND, bowel sounds + Extremities: no edema, no cyanosis    The results of significant diagnostics from this hospitalization (including imaging, microbiology, ancillary and laboratory) are listed below for reference.     Microbiology: No results found for this or any previous visit (from the past 240 hours).   Labs: BNP (last 3 results) No results for input(s): "BNP" in the last 8760 hours. Basic Metabolic Panel: Recent Labs  Lab 06/19/23 1850 06/20/23 0350  NA 138 138  K 3.7 3.8  CL 102 103  CO2 25 27  GLUCOSE 91 119*  BUN 13 10  CREATININE 0.75 0.73  CALCIUM 9.0 8.7*   Liver Function Tests: Recent Labs  Lab 06/20/23 0350  AST 38  ALT 34  ALKPHOS 48  BILITOT 0.7  PROT 6.8  ALBUMIN 3.3*   No results for input(s): "LIPASE", "AMYLASE" in the last 168 hours. Recent Labs  Lab 06/20/23 0350  AMMONIA 29   CBC: Recent Labs  Lab 06/19/23 1850  06/20/23 0350  WBC 6.1 6.9  NEUTROABS 2.6  --   HGB 13.5 14.2  HCT 40.7 44.5  MCV 86.8 88.8  PLT 175 178   Cardiac Enzymes: No results for input(s): "CKTOTAL", "CKMB", "CKMBINDEX", "TROPONINI" in the last 168 hours. BNP: Invalid input(s): "POCBNP" CBG: Recent Labs  Lab 06/21/23 0629 06/21/23 1151 06/21/23 1641 06/21/23 2122 06/22/23 0623  GLUCAP 73 106* 120* 120* 102*   D-Dimer No results for input(s): "DDIMER" in the last 72 hours. Hgb A1c Recent Labs    06/20/23 0905  HGBA1C 6.8*   Lipid Profile No results for input(s): "CHOL", "HDL", "LDLCALC", "TRIG", "CHOLHDL", "LDLDIRECT" in the last 72 hours. Thyroid  function studies No results for input(s): "TSH", "T4TOTAL", "T3FREE", "THYROIDAB" in the last 72 hours.  Invalid input(s): "FREET3" Anemia work up No results for input(s): "VITAMINB12", "FOLATE", "FERRITIN", "TIBC", "IRON", "RETICCTPCT" in the last 72 hours. Urinalysis    Component Value Date/Time   COLORURINE AMBER (A) 12/14/2022 1752   APPEARANCEUR HAZY (A) 12/14/2022 1752   LABSPEC 1.029 12/14/2022 1752   PHURINE 5.0 12/14/2022 1752   GLUCOSEU NEGATIVE 12/14/2022 1752   HGBUR NEGATIVE 12/14/2022 1752   BILIRUBINUR SMALL (A) 12/14/2022 1752   KETONESUR 5 (A) 12/14/2022 1752   PROTEINUR 30 (A) 12/14/2022 1752   NITRITE NEGATIVE 12/14/2022 1752   LEUKOCYTESUR NEGATIVE 12/14/2022 1752   Sepsis Labs Recent Labs  Lab 06/19/23 1850 06/20/23 0350  WBC 6.1 6.9   Microbiology No results found for this or any previous visit (from the past 240 hours).  FURTHER DISCHARGE INSTRUCTIONS:   Get Medicines reviewed and adjusted: Please take all your medications with you for your next visit with your Primary MD   Laboratory/radiological data: Please request your Primary MD to go over all hospital tests and procedure/radiological results at the follow up, please ask your Primary MD to get all Hospital records sent to his/her office.   In some cases, they will  be blood work, cultures and biopsy results pending at the time of your discharge. Please request that your primary care M.D. goes through all the records of your hospital data and follows up on these results.   Also Note the following: If you experience worsening of your admission symptoms, develop shortness of breath, life threatening emergency, suicidal or homicidal thoughts you must seek medical attention immediately by calling 911 or calling your MD immediately  if symptoms less severe.   You must read complete instructions/literature along with all the possible adverse reactions/side effects for all the Medicines you take and that have been prescribed to you. Take any new Medicines after you have completely understood and accpet all the possible adverse reactions/side effects.    Do not drive when taking Pain medications or sleeping medications (Benzodaizepines)   Do not take more than prescribed Pain, Sleep and Anxiety Medications. It is not advisable to combine anxiety,sleep and pain medications without talking with your primary care practitioner   Special Instructions: If you have smoked or chewed Tobacco  in the last 2 yrs please stop smoking, stop any regular Alcohol  and or any Recreational drug use.   Wear Seat belts while driving.   Please note: You were cared for by a hospitalist during your hospital stay. Once you are discharged, your primary care physician will handle any further medical issues. Please note that NO REFILLS for any discharge medications will be authorized once you are discharged, as it is imperative that you return to your primary care physician (or establish a relationship with a primary care physician if you do not have one) for your post hospital discharge needs so that they can reassess your need for medications and monitor your lab values  Time coordinating discharge: Over 30 minutes  SIGNED:   Modena Andes, MD  [facility] 06/22/2023, 10:14  AM *Please note that this is a verbal dictation therefore any spelling or grammatical errors are due to the "Dragon [employer] One" system interpretation. If 7PM-7AM, please contact night-coverage [email or web address]

## 2023-06-22 NOTE — Consult Note (Addendum)
   Providing Compassionate, Quality Care - Together  Neurosurgery Consult  Reason for referral: Hydrocephalus  Chief Complaint: Gait change  History of Present Illness: Pt reports recently being dx'd w/ hydrocephalus in [place]. She presented to [facility] w/ CC of increasing gait disturbance. Current no N/V, AMS, visual change.    Physical Exam:  Vital signs in last 24 hours: Temp:  [98 F (36.7 C)-98.3 F (36.8 C)] 98 F (36.7 C) (07/25 1814) Pulse Rate:  [58-128] 65 (07/26 0746) Resp:  [11-18] 14 (07/26 0217) BP: (138-182)/(65-125) 153/88 (07/26 0700) SpO2:  [91 %-98 %] 96 % (07/26 0746)  PE: Awake, alert, oriented Speech fluent, appropriate CN grossly intact Fine motor control intact  MRI Brain: IMPRESSION: 1. 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 2. Expanded and partially empty sella turcica, which can be seen in the setting of idiopathic intracranial hypertension.   Impression/Assessment:  This is a 53yo female with known hydrocephalus with mildly worsening clinical symptoms and slightly worsening ventricular enlargement.   Plan:  Patient is stable for dc to home today from NSGY standpoint. Prior to dc obtain CTH w/ stealth protocol as patient may ultimately be a candidate for intervention in the form of VP shunt vs ETV, etc. F/u outpt for further evaluation/mgmt. Call w/  questions/concerns.   Dr. Andy Bannister to speak with patient's husband via phone call this AM to confirm plan of care.   SARA CAYLIN TOMLINSON, PA-C   I have seen and examined the patient this morning.  NAD, fully alert, Ox3. Vision grossly intact, no complaints of headache or blury vision.  Chronic untreated hydrocephalus, obstructive pattern though I do not see any aqueductal webbing on MRI.  Mildly worse over past 6 years.  Given worsening gait in presence of increasing size, it is reasonable  to consider CSF diversion.  She appears to be a candidate for endoscopic third ventriculostomy.  I inquired into operating room availability and was informed there was no availability this week due to neuro room shortage.  As she is not in extremis, we can arrange for the surgery as an outpatient.  If she is not able to be discharged and she remains inpatient, we could potentially schedule this for next week if she and family wish to proceed. I discussed this with the patient who expressed her strong preference to be discharged.

## 2023-06-22 NOTE — Progress Notes (Signed)
 This RN found the pt sitting on the floor with her back on the bed when I went to get vitals. I asked pt if she fell and she denied saying she was wet and didn't want to sit on the bed. When I tried to get her up, she was very weak which makes me suspect that she slipped while sitting at bedside. Pt is confused but thinks she isn't, she has refused fall precautions saying she can manage. She has been independent all day but did have history of falls at home. She needed 2x assist to get back to bed, she is now amenable for fall precautions and to call for assistance when needed. VS taken and stable. No injuries noted. Pt denies pain. Bed alarm activated, floor mat in place. Dr. Del Favia informed. Will continue to monitor pt.   06/22/23 0028  What Happened  Was fall witnessed? No  Was patient injured? No  Patient found on floor  Found by Staff-comment  Stated prior activity other (comment) (pt said she sat on the floor cause she's wet and didn't want to sit on the bed; suspect she slid on bedside but pt confused and incongruent)  Provider Notification  Provider Name/Title Reesa Cannon, MD  Date Provider Notified 06/22/23  Time Provider Notified 0050  Method of Notification Page  Notification Reason Fall  Provider response No new orders  Date of Provider Response 06/22/23  Time of Provider Response 0052  Follow Up  Family notified No - patient refusal  Progress note created (see row info) Yes  Adult Fall Risk Assessment  Risk Factor Category (scoring not indicated) High fall risk per protocol (document High fall risk)  Adult Fall Risk Interventions  Additional Interventions Use of appropriate toileting equipment (bedpan, BSC, etc.);Reorient/diversional activities with confused patients  Screening for Fall Injury Risk (To be completed on HIGH fall risk patients) - Assessing Need for Floor Mats  Risk For Fall Injury- Criteria for Floor Mats Admitted as a result of a fall  Will Implement Floor Mats  Yes  Vitals  Temp 98.3 F (36.8 C)  Temp Source Oral  BP 135/82  MAP (mmHg) 99  BP Location Left Arm  BP Method Automatic  Patient Position (if appropriate) Lying  Pulse Rate 90  Pulse Rate Source Monitor  Resp 19  Oxygen Therapy  SpO2 98 %  O2 Device Room Air  Pain Assessment  Pain Scale 0-10  Pain Score 0  Neurological  Neuro (WDL) X  Level of Consciousness Alert  Orientation Level Oriented to person;Oriented to place;Disoriented to time;Disoriented to situation  Cognition Poor attention/concentration;Poor judgement;Poor safety awareness;Follows commands  Speech Clear  R Pupil Size (mm) 3  R Pupil Shape Round  R Pupil Reaction Brisk  L Pupil Size (mm) 3  L Pupil Shape Round  L Pupil Reaction Brisk  Neuro Symptoms Forgetful  Glasgow Coma Scale  Eye Opening 4  Best Verbal Response (NON-intubated) 4  Best Motor Response 6  Musculoskeletal  Musculoskeletal (WDL) X  Assistive Device Front wheel walker  Generalized Weakness Yes  Weight Bearing Restrictions Per Provider Order No  Integumentary  Integumentary (WDL) WDL

## 2023-06-22 NOTE — Progress Notes (Signed)
 I called the patient's daughter this afternoon.  Unfortunately, I could only leave a message.  I am available to meet with the family tomorrow for further discussion of possible surgery.

## 2023-06-22 NOTE — Progress Notes (Signed)
 Physical Therapy Treatment Patient Details Name: Alison Collins MRN: [id number] DOB: 06-06-1971 Today's Date: 06/22/2023   History of Present Illness 53 y.o. female who presents 06/19/23 for evaluation of confusion, falls, and hydrocephalus. Hydrochephalus diagnosed 05/2023 in [place] .  PMH significant for hypertension, seizures, and recent diagnosis of diabetes mellitus    PT Comments  Patient with significant decline in cognition and functional ability compared to 06/21/23. Patient unable to problem-solve how to come to sit at EOB (yesterday was independent including managing linens). Required mod assist to come to EOB and with strong posterior lean initially. Sit to stand mod assist again with strong posterior lean. Required 2 person assist to step-pivot to/from [facility] and unable to safely ambulate with RW due to decr cognition and imbalance. This patient was able to ambulate without device with CGA and up/down 2 steps with rail and min assist 06/21/23. RN and Dr Lilyan Remedies in to see pt during session and made aware. Have reached out to Neurosurgery as well. Per daughter, she does not have 24/7 assist at home and due to currently requires 2 person assist for transfers, must recommend SNF. Will continue to follow.    If plan is discharge home, recommend the following: Assist for transportation;Two people to help with walking and/or transfers;Assistance with feeding;Direct supervision/assist for medications management;Direct supervision/assist for financial management;Help with stairs or ramp for entrance;Supervision due to cognitive status   Can travel by private vehicle     No  Equipment Recommendations  Rolling walker (2 wheels)    Recommendations for Other Services       Precautions / Restrictions Precautions Precautions: Fall Precaution Comments: functional status very different 1/14 to 1/15 Restrictions Weight Bearing Restrictions Per Provider Order: No     Mobility  Bed Mobility Overal  bed mobility: Needs Assistance Bed Mobility: Supine to Sit, Sit to Supine     Supine to sit: Mod assist, HOB elevated, Used rails Sit to supine: Mod assist   General bed mobility comments: more confused and could not figure out how to come to sit on EOB; mod assist for moving legs over EOB and raising torso    Transfers Overall transfer level: Needs assistance Equipment used: Rolling walker (2 wheels) Transfers: Sit to/from Stand, Bed to chair/wheelchair/BSC Sit to Stand: Mod assist   Step pivot transfers: Mod assist, +2 physical assistance       General transfer comment: Patient with strong posterior bias with each sit to stand (x3 reps); NT in to assist with transfer to [facility] to allow change of linens (bed soaked)    Ambulation/Gait               General Gait Details: due to significant posterior imbalance, unsafe to ambulate with +1 assist; bed to [facility] to bed only   Stairs             Wheelchair Mobility     Tilt Bed    Modified Rankin (Stroke Patients Only)       Balance Overall balance assessment: Needs assistance, History of Falls Sitting-balance support: No upper extremity supported, Feet supported Sitting balance-Leahy Scale: Poor Sitting balance - Comments: posterior lean   Standing balance support: During functional activity, Bilateral upper extremity supported Standing balance-Leahy Scale: Poor                              Cognition Arousal: Alert Behavior During Therapy: WFL for tasks assessed/performed Overall Cognitive Status: Impaired/Different from baseline  Area of Impairment: Orientation, Attention, Memory, Following commands, Safety/judgement, Awareness, Problem solving                 Orientation Level: Disoriented to, Time, Situation Current Attention Level: Focused Memory: Decreased recall of precautions, Decreased short-term memory Following Commands: Follows one step commands inconsistently, Follows one step  commands with increased time Safety/Judgement: Decreased awareness of safety, Decreased awareness of deficits   Problem Solving: Slow processing, Decreased initiation, Difficulty sequencing, Requires verbal cues, Requires tactile cues General Comments: pt significantly different compared to 1/14; not oriented to year or situation; requires repetition of commands with verbal and tactile cuing for bed mobility (step by step)        Exercises      General Comments General comments (skin integrity, edema, etc.): DAughter present. RN and Dr Lilyan Remedies in to see pt during session. Secure chat sent to Neurosurgery PA and RN called office to leave a message.      Pertinent Vitals/Pain Pain Assessment Pain Assessment: No/denies pain    Home Living                          Prior Function            PT Goals (current goals can now be found in the care plan section) Acute Rehab PT Goals Patient Stated Goal: go home today Time For Goal Achievement: 07/05/23 Potential to Achieve Goals: Good Progress towards PT goals: Not progressing toward goals - comment (sig decline)    Frequency    Min 1X/week      PT Plan      Co-evaluation              AM-PAC PT "6 Clicks" Mobility   Outcome Measure  Help needed turning from your back to your side while in a flat bed without using bedrails?: A Lot Help needed moving from lying on your back to sitting on the side of a flat bed without using bedrails?: A Lot Help needed moving to and from a bed to a chair (including a wheelchair)?: Total Help needed standing up from a chair using your arms (e.g., wheelchair or bedside chair)?: A Lot Help needed to walk in hospital room?: Total Help needed climbing 3-5 steps with a railing? : Total 6 Click Score: 9    End of Session Equipment Utilized During Treatment: Gait belt Activity Tolerance: Treatment limited secondary to medical complications (Comment) (significant cognitive and  functional changes) Patient left: in bed;with call bell/phone within reach;with bed alarm set;with family/visitor present Nurse Communication: Mobility status;Other (comment) (change in status) PT Visit Diagnosis: Unsteadiness on feet (R26.81);Other abnormalities of gait and mobility (R26.89);History of falling (Z91.81)     Time: 1610-9604 PT Time Calculation (min) (ACUTE ONLY): 35 min  Charges:    $Therapeutic Activity: 23-37 mins PT General Charges $$ ACUTE PT VISIT: 1 Visit                      Gayle Kava, PT Acute Rehabilitation Services  Office [phone]    Guilford Leep 06/22/2023, 10:54 AM

## 2023-06-22 NOTE — Progress Notes (Signed)
 Seen by neurosurgery and discharge completed since they cleared her for discharge.  The daughter came to the bedside, wanted to talk to provider.  I spoke to her and answered all the questions about the plan of care that neurosurgery has formulated.  She was very grateful for me to come talk to her however she was requesting to talk to neurosurgery directly.  Sent a message to neurosurgery nurse practitioner Abraham Hoffmann about her request.  Additionally, patient was being evaluated by PT when I went to talk to the daughter again.  PT said "much worse today. Strong posterior bias and only safe to transfer with 2 people to [facility]. Could not ambulate--VERY different than yesterday. Based on mobility, today I would recommend SNF" also PT personally told me that they would like for neurosurgery to weigh in as well as she may need surgery sooner than later.  I have notified neurosurgery about that as well.  Canceling discharge based on above new developments.

## 2023-06-22 NOTE — Plan of Care (Signed)
   Problem: Fluid Volume: Goal: Ability to maintain a balanced intake and output will improve Outcome: Progressing

## 2023-06-22 NOTE — Plan of Care (Signed)
  Problem: Fluid Volume: Goal: Ability to maintain a balanced intake and output will improve Outcome: Progressing   Problem: Metabolic: Goal: Ability to maintain appropriate glucose levels will improve Outcome: Progressing   Problem: Activity: Goal: Risk for activity intolerance will decrease Outcome: Progressing   Problem: Coping: Goal: Level of anxiety will decrease Outcome: Progressing   Problem: Safety: Goal: Ability to remain free from injury will improve Outcome: Progressing

## 2023-06-23 DIAGNOSIS — E669 Obesity, unspecified: Secondary | ICD-10-CM | POA: Diagnosis present

## 2023-06-23 DIAGNOSIS — E119 Type 2 diabetes mellitus without complications: Secondary | ICD-10-CM | POA: Diagnosis present

## 2023-06-23 DIAGNOSIS — Z8632 Personal history of gestational diabetes: Secondary | ICD-10-CM | POA: Diagnosis not present

## 2023-06-23 DIAGNOSIS — I10 Essential (primary) hypertension: Secondary | ICD-10-CM | POA: Diagnosis present

## 2023-06-23 DIAGNOSIS — R260 Ataxic gait: Secondary | ICD-10-CM | POA: Diagnosis present

## 2023-06-23 DIAGNOSIS — Z7984 Long term (current) use of oral hypoglycemic drugs: Secondary | ICD-10-CM | POA: Diagnosis not present

## 2023-06-23 DIAGNOSIS — Z880 Allergy status to penicillin: Secondary | ICD-10-CM | POA: Diagnosis not present

## 2023-06-23 DIAGNOSIS — R296 Repeated falls: Secondary | ICD-10-CM | POA: Diagnosis present

## 2023-06-23 DIAGNOSIS — Z820 Family history of epilepsy and other diseases of the nervous system: Secondary | ICD-10-CM | POA: Diagnosis not present

## 2023-06-23 DIAGNOSIS — E66813 Obesity, class 3: Secondary | ICD-10-CM | POA: Diagnosis present

## 2023-06-23 DIAGNOSIS — Z9181 History of falling: Secondary | ICD-10-CM | POA: Diagnosis not present

## 2023-06-23 DIAGNOSIS — Z91018 Allergy to other foods: Secondary | ICD-10-CM | POA: Diagnosis not present

## 2023-06-23 DIAGNOSIS — Z23 Encounter for immunization: Secondary | ICD-10-CM | POA: Diagnosis not present

## 2023-06-23 DIAGNOSIS — F418 Other specified anxiety disorders: Secondary | ICD-10-CM | POA: Diagnosis not present

## 2023-06-23 DIAGNOSIS — Z6835 Body mass index (BMI) 35.0-35.9, adult: Secondary | ICD-10-CM | POA: Diagnosis not present

## 2023-06-23 DIAGNOSIS — Z8249 Family history of ischemic heart disease and other diseases of the circulatory system: Secondary | ICD-10-CM | POA: Diagnosis not present

## 2023-06-23 DIAGNOSIS — G911 Obstructive hydrocephalus: Secondary | ICD-10-CM | POA: Diagnosis present

## 2023-06-23 DIAGNOSIS — G40909 Epilepsy, unspecified, not intractable, without status epilepticus: Secondary | ICD-10-CM | POA: Diagnosis present

## 2023-06-23 DIAGNOSIS — Z79899 Other long term (current) drug therapy: Secondary | ICD-10-CM | POA: Diagnosis not present

## 2023-06-23 DIAGNOSIS — G919 Hydrocephalus, unspecified: Secondary | ICD-10-CM | POA: Diagnosis present

## 2023-06-23 DIAGNOSIS — Z8616 Personal history of COVID-19: Secondary | ICD-10-CM | POA: Diagnosis not present

## 2023-06-23 LAB — GLUCOSE, CAPILLARY
Glucose-Capillary: 98 mg/dL (ref 70–99)
Glucose-Capillary: 104 mg/dL — ABNORMAL HIGH (ref 70–99)
Glucose-Capillary: 128 mg/dL — ABNORMAL HIGH (ref 70–99)
Glucose-Capillary: 118 mg/dL — ABNORMAL HIGH (ref 70–99)

## 2023-06-23 MED ORDER — LISINOPRIL 20 MG PO TABS
20.0000 mg | ORAL_TABLET | Freq: Every day | ORAL | Status: DC
  Administered 2023-06-23 – 2023-06-29 (×7): 20 mg via ORAL
  Filled 2023-06-23 (×7): qty 1

## 2023-06-23 NOTE — Plan of Care (Signed)
  Problem: Fluid Volume: Goal: Ability to maintain a balanced intake and output will improve Outcome: Progressing   Problem: Health Behavior/Discharge Planning: Goal: Ability to identify and utilize available resources and services will improve Outcome: Progressing

## 2023-06-23 NOTE — Progress Notes (Signed)
OK for patient to be without PIV authorized by Dr Jacqulyn Bath.

## 2023-06-23 NOTE — Progress Notes (Signed)
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  Spouse concerned that she may be taking extra doses of her medications due to forgetting that she had already taken them.  Admitted for obstructive hydrocephalus.  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  She is not safe to discharge home, needs SNF, neurosurgery cannot perform surgery until next Tuesday due to unavailability of the OR time, patient and her husband has decided to stay in the hospital until surgery is completed.  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  Per PT, she is not safe to discharge home, needs SNF, neurosurgery cannot perform surgery until next Tuesday due to unavailability of the OR time, patient and her husband has decided to stay in the hospital until surgery is completed.  Seizure disorder/supratherapeutic valproate level Valproate level 133 in ED, spouse believes that patient was taking extra doses of her medication due to forgetting that she had already taken it Depakote was briefly held, levels were closely monitored.  Valproate level down to 45 on 1/14. As discussed with neurology, no need of loading dose, continue prior home maintenance dose.  Resumed valproate. Will repeat valproate level tomorrow that would be 72 hours from the last check.  Patient has been given clear instructions that patient should not take more than the advised dose of valproate and spouse can supervise medications. Pharmacy reviewed home med list and no drug to drug interactions that would cause increase in valproate levels.   Essential hypertension Mildly uncontrolled.  Continue HCTZ 25 Mg daily.  Added lisinopril 20 mg p.o. daily which will help in prevention of diabetic nephropathy as well.  Renal function is normal at the moment.   Newly diagnosed type II DM A1c 6.8. Good inpatient control on NovoLog SSI.   Body mass index is 35.42 kg/m./Obesity Weight loss and diet modification counseled.  DVT prophylaxis: SCDs Start: 06/19/23 2009   Code Status: Full Code  Family Communication: Husband present at bedside.  Plan of care discussed with patient in length and he/she verbalized understanding and agreed with it.  Status is: Observation The patient will require care spanning > 2 midnights and should be moved to inpatient because: Patient is scheduled to have neurosurgery  on Tuesday next week.   Estimated body mass index is 35.42 kg/m as calculated from  the following:   Height as of this encounter: 5\' 3"  (1.6 m).   Weight as of 03/04/23: 90.7 kg.    Nutritional Assessment: Body mass index is 35.42 kg/m.Marland Kitchen Seen by dietician.  I agree with the assessment and plan as outlined below: Nutrition Status:        . Skin Assessment: I have examined the patient's skin and I agree with the wound assessment as performed by the wound care RN as outlined below:    Consultants:  Neurosurgery  Procedures:  None  Antimicrobials:  Anti-infectives (From admission, onward)    None         Subjective: Seen and examined.  Patient initially was once again eager to go home, her husband was at the bedside who was concerned about her going home because he works and she will be home alone for 10 to 12 hours a day and she is high fall risk.  Lengthy discussion with the patient, I left them alone to have further discussion between the 2 of them and later on they told me that they have decided that they would rather stay in the hospital to have the surgery done.  Objective: Vitals:   06/23/23 0104 06/23/23 0400 06/23/23 0749 06/23/23 1125  BP: (!) 133/97 136/83 (!) 162/92 (!) 141/89  Pulse: 99 90 92 91  Resp: 18 16 19 17   Temp: 98.8 F (37.1 C) 99.3 F (37.4 C) 98.5 F (36.9 C) 98.4 F (36.9 C)  TempSrc: Oral Oral Oral Oral  SpO2: 93% 96% 94% 96%  Height:       No intake or output data in the 24 hours ending 06/23/23 1251 There were no vitals filed for this visit.  Examination:  General exam: Appears calm and comfortable  Respiratory system: Clear to auscultation. Respiratory effort normal. Cardiovascular system: S1 & S2 heard, RRR. No JVD, murmurs, rubs, gallops or clicks. No pedal edema. Gastrointestinal system: Abdomen is nondistended, soft and nontender. No organomegaly or masses felt. Normal bowel sounds heard. Central nervous system: Alert and oriented. No focal neurological deficits.  Patient has gait instability. Extremities:  Symmetric 5 x 5 power. Skin: No rashes, lesions or ulcers Psychiatry: Judgement and insight appear normal. Mood & affect appropriate.    Data Reviewed: I have personally reviewed following labs and imaging studies  CBC: Recent Labs  Lab 06/19/23 1850 06/20/23 0350  WBC 6.1 6.9  NEUTROABS 2.6  --   HGB 13.5 14.2  HCT 40.7 44.5  MCV 86.8 88.8  PLT 175 178   Basic Metabolic Panel: Recent Labs  Lab 06/19/23 1850 06/20/23 0350  NA 138 138  K 3.7 3.8  CL 102 103  CO2 25 27  GLUCOSE 91 119*  BUN 13 10  CREATININE 0.75 0.73  CALCIUM 9.0 8.7*   GFR: CrCl cannot be calculated (Unknown ideal weight.). Liver Function Tests: Recent Labs  Lab 06/20/23 0350  AST 38  ALT 34  ALKPHOS 48  BILITOT 0.7  PROT 6.8  ALBUMIN 3.3*   No results for input(s): "LIPASE", "AMYLASE" in the last 168 hours. Recent Labs  Lab 06/20/23 0350  AMMONIA 29   Coagulation Profile: No results for input(s): "INR", "PROTIME" in the last 168 hours. Cardiac Enzymes: No results for input(s): "CKTOTAL", "CKMB", "CKMBINDEX", "TROPONINI" in the last 168 hours. BNP (last 3 results) No results for input(s): "PROBNP" in the last 8760 hours. HbA1C: No  results for input(s): "HGBA1C" in the last 72 hours. CBG: Recent Labs  Lab 06/22/23 1133 06/22/23 1715 06/22/23 2136 06/23/23 0609 06/23/23 1126  GLUCAP 138* 118* 153* 98 104*   Lipid Profile: No results for input(s): "CHOL", "HDL", "LDLCALC", "TRIG", "CHOLHDL", "LDLDIRECT" in the last 72 hours. Thyroid Function Tests: No results for input(s): "TSH", "T4TOTAL", "FREET4", "T3FREE", "THYROIDAB" in the last 72 hours. Anemia Panel: No results for input(s): "VITAMINB12", "FOLATE", "FERRITIN", "TIBC", "IRON", "RETICCTPCT" in the last 72 hours. Sepsis Labs: No results for input(s): "PROCALCITON", "LATICACIDVEN" in the last 168 hours.  No results found for this or any previous visit (from the past 240 hours).   Radiology Studies: CT DBS HEAD W/O  CONTRAST ( ) Result Date: 06/22/2023 CLINICAL DATA:  Provided history: Hydrocephalus. EXAM: CT HEAD WITHOUT CONTRAST TECHNIQUE: Contiguous axial images were obtained from the base of the skull through the vertex without intravenous contrast. RADIATION DOSE REDUCTION: This exam was performed according to the departmental dose-optimization program which includes automated exposure control, adjustment of the mA and/or kV according to patient size and/or use of iterative reconstruction technique. COMPARISON:  Brain MRI 06/21/2023. FINDINGS: Brain: No age-advanced or lobar predominant cerebral atrophy. Lateral and third ventriculomegaly consistent with hydrocephalus, unchanged from the prior brain MRI of 06/21/2023. Moderate patchy and ill-defined hypoattenuation within the cerebral white matter. Partially empty sella turcica. There is no acute intracranial hemorrhage. No demarcated cortical infarct. No extra-axial fluid collection. No evidence of an intracranial mass. No midline shift. Vascular: No hyperdense vessel. Skull: No calvarial fracture or aggressive osseous lesion. Sinuses/Orbits: No mass or acute finding within the imaged orbits. No significant paranasal sinus disease. IMPRESSION: 1. Lateral and third ventriculomegaly consistent with hydrocephalus, unchanged from the prior brain MRI of 06/21/2023. 2. Nonspecific chronic cerebral white matter disease. Superimposed transependymal interstitial edema may be present. 3. Partially empty sella turcica. This finding can reflect incidental anatomic variation, or alternatively, it can be associated with chronic idiopathic intracranial hypertension (pseudotumor cerebri). Electronically Signed   By: Jackey Loge D.O.   On: 06/22/2023 11:52    Scheduled Meds:  cyanocobalamin  1,000 mcg Oral Daily   divalproex  500 mg Oral BID   hydrochlorothiazide  25 mg Oral Daily   insulin aspart  0-9 Units Subcutaneous TID WC   sodium chloride flush  3 mL Intravenous Q12H    Continuous Infusions:   LOS: 0 days   Hughie Closs, MD [facility]  06/23/2023, 12:51 PM   *Please note that this is a verbal dictation therefore any spelling or grammatical errors are due to the "Dragon Medical One" system interpretation.  Please page via Amion and do not message via secure chat for urgent patient care matters. Secure chat can be used for non urgent patient care matters.  How to contact the [facility] Attending or Consulting provider 7A - 7P or covering provider during after hours 7P -7A, for this patient?  Check the care team in [facility] and look for a) attending/consulting TRH provider listed and b) the [facility] team listed. Page or secure chat 7A-7P. Log into [email or web address] and use [place]'s universal password to access. If you do not have the password, please contact the hospital operator. Locate the [facility] provider you are looking for under [facility] and page to a number that you can be directly reached. If you still have difficulty reaching the provider, please page the [facility] (Director on Call) for the Hospitalists listed on amion for assistance.

## 2023-06-23 NOTE — Plan of Care (Signed)

## 2023-06-23 NOTE — Progress Notes (Signed)
Neurosurgery  I had a long discussion with the patient and husband at the bedside.  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  The slow increase in her ventricle size does seem to correlate with her progressive gait difficulty so it is reasonable to assume she would respond well clinically to a csf diversion procedure.   generally, I do not advise a large volume lumbar puncture for diagnosis in these situations given her obstructive pattern.  I discussed options of ETV vs shunt and pros and cons of each.  I advised ETV would be preferable to avoid foreign body implantation, avoid issues of shunt over- and underdrainage  and minimize future surgeries for shunt malfunction or infection.  Risks, benefits, alternatives, and expected comments were discussed with both. Risks discussed included, but were not limited to, bleeding, pain, infection, scar, seizure, stroke, neurologic deficit, hypothalamic dysfunction, vision change, recurrence, and death. They did wish to proceed. She is currently scheduled for surgery Tuesday which is the next available operating room time. I explained that with this surgery, benefits may not be immediate and may take several weeks to manifest.  All questions and concerns were answered.

## 2023-06-24 DIAGNOSIS — G911 Obstructive hydrocephalus: Secondary | ICD-10-CM | POA: Diagnosis not present

## 2023-06-24 LAB — GLUCOSE, CAPILLARY
Glucose-Capillary: 90 mg/dL (ref 70–99)
Glucose-Capillary: 143 mg/dL — ABNORMAL HIGH (ref 70–99)
Glucose-Capillary: 141 mg/dL — ABNORMAL HIGH (ref 70–99)
Glucose-Capillary: 140 mg/dL — ABNORMAL HIGH (ref 70–99)

## 2023-06-24 LAB — VALPROIC ACID LEVEL: Valproic Acid Lvl: 73 ug/mL (ref 50.0–100.0)

## 2023-06-24 NOTE — Plan of Care (Signed)

## 2023-06-24 NOTE — TOC Progression Note (Signed)
Transition of Care [facility]) - Progression Note    Patient Details  Name: Alison Collins MRN: [id number] Date of Birth: 1970-10-05  [facility] of [facility] CM/SW Contact  Baldemar Lenis, [place] Phone Number: 06/24/2023, 3:53 PM  Clinical Narrative:   CSW acknowledging recommendation for SNF. Patient pending surgery on Tuesday, will need therapy evaluations after surgery. CSW to follow for disposition.    Expected Discharge Plan: Skilled Nursing Facility Barriers to Discharge: Continued Medical Work up  Expected Discharge Plan and Services   Discharge Planning Services: CM Consult   Living arrangements for the past 2 months: Apartment Expected Discharge Date: 06/22/23                                     Social Determinants of Health (SDOH) Interventions SDOH Screenings   Food Insecurity: No Food Insecurity (06/19/2023)  Housing: Low Risk  (06/19/2023)  Transportation Needs: Unmet Transportation Needs (06/19/2023)  Utilities: Not At Risk (06/19/2023)  Tobacco Use: Low Risk  (06/19/2023)    Readmission Risk Interventions     No data to display

## 2023-06-24 NOTE — Plan of Care (Signed)
  Problem: Coping: Goal: Ability to adjust to condition or change in health will improve Outcome: Progressing   Problem: Health Behavior/Discharge Planning: Goal: Ability to identify and utilize available resources and services will improve Outcome: Progressing   Problem: Skin Integrity: Goal: Risk for impaired skin integrity will decrease Outcome: Progressing   Problem: Tissue Perfusion: Goal: Adequacy of tissue perfusion will improve Outcome: Progressing

## 2023-06-24 NOTE — Progress Notes (Signed)
Physical Therapy Treatment Patient Details Name: Alison Collins MRN: [id number] DOB: May 15, 1971 Today's Date: 06/24/2023   History of Present Illness 53 y.o. female who presents 06/19/23 for evaluation of confusion, falls, and hydrocephalus. 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  PMH significant for hypertension, seizures, and recent diagnosis of diabetes mellitus    PT Comments  Pt sitting up in recliner, husband in room, reporting she is doing much better. Although it is obvious that pt has improved, she has constant stream of consciousness dialog during session, increased impulsiveness, divided attention and need for constant cuing. Pt's husband reports she is back to her normal state, but does not match prior therapy documentation. Pt no longer has posterior lean, but need minA for transfers and min A with close chair follow to ambulate in hallway. Pt needs constant cuing to focus on walking and for safety. D/c plan remains appropriate given no 24/7 supervision. PT will continue to follow and reassess.      If plan is discharge home, recommend the following: Assist for transportation;Two people to help with walking and/or transfers;Assistance with feeding;Direct supervision/assist for medications management;Direct supervision/assist for financial management;Help with stairs or ramp for entrance;Supervision due to cognitive status   Can travel by private vehicle     No  Equipment Recommendations  Rolling walker (2 wheels)       Precautions / Restrictions Precautions Precautions: Fall Precaution Comments: functional status very different 1/14 to 1/15 Restrictions Weight Bearing Restrictions Per Provider Order: No     Mobility  Bed Mobility               General bed mobility comments: sitting up in recliner    Transfers Overall transfer level: Needs assistance Equipment used: Rolling walker (2 wheels) Transfers: Sit to/from Stand Sit to Stand: Min assist            General transfer comment: Pt posterior lean is gone, however pt requires increased cuing for safety and task at hands, vc for hand placement for power up and safely return to seated, pt with decreased eccentric control for descent, had pt practice with placing hand on arm rest    Ambulation/Gait Ambulation/Gait assistance: Min assist, +2 safety/equipment (maximal cuing) Gait Distance (Feet): 60 Feet Assistive device: Rolling walker (2 wheels) Gait Pattern/deviations: Step-through pattern, Decreased step length - right, Decreased step length - left   Gait velocity interpretation: <1.8 ft/sec, indicate of risk for recurrent falls   General Gait Details: pt impulsive and easily requires maximal cuing for safety, keeping UE on walker, keeping attention on task at hand, making larger strides          Balance Overall balance assessment: Needs assistance, History of Falls Sitting-balance support: No upper extremity supported, Feet supported Sitting balance-Leahy Scale: Poor     Standing balance support: During functional activity, Bilateral upper extremity supported Standing balance-Leahy Scale: Poor Standing balance comment: requires light assist throughout for maintaining balance                            Cognition Arousal: Alert Behavior During Therapy: Impulsive Overall Cognitive Status: Impaired/Different from baseline Area of Impairment: Orientation, Attention, Memory, Following commands, Safety/judgement, Awareness, Problem solving                 Orientation Level: Disoriented to, Situation Current Attention Level: Selective Memory: Decreased recall of precautions, Decreased short-term memory Following Commands: Follows one step commands inconsistently, Follows one step commands with increased time  Safety/Judgement: Decreased awareness of safety, Decreased awareness of deficits   Problem Solving: Slow processing, Decreased initiation, Difficulty  sequencing, Requires verbal cues, Requires tactile cues General Comments: pt's husband reports her cognition is back to baseline, however pt with easily distracted requires constant cuing for safety and use of RW, impulsive taking hands of RW and attempting to enter other patients room to "visit"           General Comments General comments (skin integrity, edema, etc.): Husband present and reports she is at baseline cognition and behavior      Pertinent Vitals/Pain Pain Assessment Pain Assessment: No/denies pain     PT Goals (current goals can now be found in the care plan section) Acute Rehab PT Goals Patient Stated Goal: go home today PT Goal Formulation: With patient Time For Goal Achievement: 07/05/23 Potential to Achieve Goals: Good Progress towards PT goals: Progressing toward goals    Frequency    Min 1X/week       AM-PAC PT "6 Clicks" Mobility   Outcome Measure  Help needed turning from your back to your side while in a flat bed without using bedrails?: A Lot Help needed moving from lying on your back to sitting on the side of a flat bed without using bedrails?: A Lot Help needed moving to and from a bed to a chair (including a wheelchair)?: Total Help needed standing up from a chair using your arms (e.g., wheelchair or bedside chair)?: A Lot Help needed to walk in hospital room?: Total Help needed climbing 3-5 steps with a railing? : Total 6 Click Score: 9    End of Session Equipment Utilized During Treatment: Gait belt Activity Tolerance: Patient tolerated treatment well (continuest to have cognitive and functional changes but improving) Patient left: with family/visitor present;in chair;with chair alarm set;with call bell/phone within reach Nurse Communication: Mobility status;Other (comment) (change in status) PT Visit Diagnosis: Unsteadiness on feet (R26.81);Other abnormalities of gait and mobility (R26.89);History of falling (Z91.81)     Time:  6578-4696 PT Time Calculation (min) (ACUTE ONLY): 23 min  Charges:    $Gait Training: 8-22 mins $Therapeutic Exercise: 8-22 mins PT General Charges $$ ACUTE PT VISIT: 1 Visit                     Mitsy Owen B. Beverely Risen PT, DPT Acute Rehabilitation Services Please use secure chat or  Call Office [phone]    Elon Alas Fleet 06/24/2023, 1:17 PM

## 2023-06-24 NOTE — Progress Notes (Signed)
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  Spouse concerned that she may be taking extra doses of her medications due to forgetting that she had already taken them.  Admitted for obstructive hydrocephalus.  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  She is not safe to discharge home, needs SNF, neurosurgery cannot perform surgery until next Tuesday due to unavailability of the OR time, patient and her husband has decided to stay in the hospital until surgery is completed.  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  Per PT, she is not safe to discharge home, needs SNF, neurosurgery cannot perform surgery until next Tuesday due to unavailability of the OR time, patient and her husband has decided to stay in the hospital until surgery is completed.  Seizure disorder/supratherapeutic valproate level Valproate level 133 in ED, spouse believes that patient was taking extra doses of her medication due to forgetting that she had already taken it Depakote was briefly held, levels were closely monitored.  Valproate level down to 45 on 1/14.  But repeat on 117 is within normal range. As discussed with neurology, continue prior home maintenance dose.  Resumed valproate.  Patient has been given clear instructions that patient should not take more than the advised dose of valproate and spouse can supervise medications. Pharmacy reviewed home med list and no drug to drug interactions that would cause increase in valproate levels.   Essential hypertension Much better controlled now.  Continue HCTZ 25 Mg daily.  Added lisinopril 20 mg p.o. daily which will help in prevention of diabetic nephropathy as well.  Renal function is normal at the moment.   Newly diagnosed type II DM A1c 6.8. Good inpatient control on NovoLog SSI.   Body mass index is 35.42 kg/m./Obesity Weight loss and diet modification counseled.  DVT prophylaxis: SCDs Start: 06/19/23 2009   Code Status: Full Code  Family Communication: Husband present at bedside.  Plan of care discussed with patient in length and he/she verbalized understanding and agreed with it.  Status is: Inpatient Remains inpatient appropriate because: Patient awaiting neurosurgery next Tuesday, 06/28/2023     Estimated body mass index is 35.42 kg/m as calculated from the following:   Height as  of this encounter: 5\' 3"  (1.6 m).   Weight as of 03/04/23: 90.7 kg.    Nutritional  Assessment: Body mass index is 35.42 kg/m.Marland Kitchen Seen by dietician.  I agree with the assessment and plan as outlined below: Nutrition Status:        . Skin Assessment: I have examined the patient's skin and I agree with the wound assessment as performed by the wound care RN as outlined below:    Consultants:  Neurosurgery  Procedures:  None  Antimicrobials:  Anti-infectives (From admission, onward)    None         Subjective: Seen and examined.  Very pleasant.  Husband at the bedside.  Patient has no new complaints today.  Objective: Vitals:   06/23/23 2330 06/24/23 0325 06/24/23 0726 06/24/23 1129  BP: 109/70 105/68 134/66 112/72  Pulse: 80 77 72 91  Resp: 18 17 17 18   Temp: 97.8 F (36.6 C) 97.7 F (36.5 C) 98.2 F (36.8 C) 98 F (36.7 C)  TempSrc: Oral Oral Oral Oral  SpO2: 96% 95% 98% 100%  Height:       No intake or output data in the 24 hours ending 06/24/23 1235 There were no vitals filed for this visit.  Examination:  General exam: Appears calm and comfortable  Respiratory system: Clear to auscultation. Respiratory effort normal. Cardiovascular system: S1 & S2 heard, RRR. No JVD, murmurs, rubs, gallops or clicks. No pedal edema. Gastrointestinal system: Abdomen is nondistended, soft and nontender. No organomegaly or masses felt. Normal bowel sounds heard. Central nervous system: Alert and oriented. No focal neurological deficits.  Patient has gait instability. Extremities: Symmetric 5 x 5 power. Skin: No rashes, lesions or ulcers Psychiatry: Judgement and insight appear normal. Mood & affect appropriate.    Data Reviewed: I have personally reviewed following labs and imaging studies  CBC: Recent Labs  Lab 06/19/23 1850 06/20/23 0350  WBC 6.1 6.9  NEUTROABS 2.6  --   HGB 13.5 14.2  HCT 40.7 44.5  MCV 86.8 88.8  PLT 175 178   Basic Metabolic Panel: Recent Labs  Lab 06/19/23 1850 06/20/23 0350  NA 138 138  K 3.7 3.8  CL 102 103  CO2  25 27  GLUCOSE 91 119*  BUN 13 10  CREATININE 0.75 0.73  CALCIUM 9.0 8.7*   GFR: CrCl cannot be calculated (Unknown ideal weight.). Liver Function Tests: Recent Labs  Lab 06/20/23 0350  AST 38  ALT 34  ALKPHOS 48  BILITOT 0.7  PROT 6.8  ALBUMIN 3.3*   No results for input(s): "LIPASE", "AMYLASE" in the last 168 hours. Recent Labs  Lab 06/20/23 0350  AMMONIA 29   Coagulation Profile: No results for input(s): "INR", "PROTIME" in the last 168 hours. Cardiac Enzymes: No results for input(s): "CKTOTAL", "CKMB", "CKMBINDEX", "TROPONINI" in the last 168 hours. BNP (last 3 results) No results for input(s): "PROBNP" in the last 8760 hours. HbA1C: No results for input(s): "HGBA1C" in the last 72 hours. CBG: Recent Labs  Lab 06/23/23 1126 06/23/23 1608 06/23/23 2111 06/24/23 0603 06/24/23 1130  GLUCAP 104* 128* 118* 90 143*   Lipid Profile: No results for input(s): "CHOL", "HDL", "LDLCALC", "TRIG", "CHOLHDL", "LDLDIRECT" in the last 72 hours. Thyroid Function Tests: No results for input(s): "TSH", "T4TOTAL", "FREET4", "T3FREE", "THYROIDAB" in the last 72 hours. Anemia Panel: No results for input(s): "VITAMINB12", "FOLATE", "FERRITIN", "TIBC", "IRON", "RETICCTPCT" in the last 72 hours. Sepsis Labs: No results for input(s): "PROCALCITON", "LATICACIDVEN" in the last 168 hours.  No results found for this or any previous visit (from the past 240 hours).   Radiology Studies: No results found.   Scheduled Meds:  cyanocobalamin  1,000 mcg Oral Daily   divalproex  500 mg Oral BID   hydrochlorothiazide  25 mg Oral Daily   insulin aspart  0-9 Units Subcutaneous TID WC   lisinopril  20 mg Oral Daily   sodium chloride flush  3 mL Intravenous Q12H   Continuous Infusions:   LOS: 1 day   Hughie Closs, MD [facility]  06/24/2023, 12:35 PM   *Please note that this is a verbal dictation therefore any spelling or grammatical errors are due to the "Dragon Medical One"  system interpretation.  Please page via Amion and do not message via secure chat for urgent patient care matters. Secure chat can be used for non urgent patient care matters.  How to contact the [facility] Attending or Consulting provider 7A - 7P or covering provider during after hours 7P -7A, for this patient?  Check the care team in [facility] and look for a) attending/consulting TRH provider listed and b) the [facility] team listed. Page or secure chat 7A-7P. Log into [email or web address] and use [place]'s universal password to access. If you do not have the password, please contact the hospital operator. Locate the [facility] provider you are looking for under [facility] and page to a number that you can be directly reached. If you still have difficulty reaching the provider, please page the [facility] (Director on Call) for the Hospitalists listed on amion for assistance.

## 2023-06-25 DIAGNOSIS — G911 Obstructive hydrocephalus: Secondary | ICD-10-CM | POA: Diagnosis not present

## 2023-06-25 LAB — GLUCOSE, CAPILLARY
Glucose-Capillary: 107 mg/dL — ABNORMAL HIGH (ref 70–99)
Glucose-Capillary: 133 mg/dL — ABNORMAL HIGH (ref 70–99)
Glucose-Capillary: 140 mg/dL — ABNORMAL HIGH (ref 70–99)
Glucose-Capillary: 128 mg/dL — ABNORMAL HIGH (ref 70–99)

## 2023-06-25 NOTE — Progress Notes (Signed)
She is awake and alert and pleasant and interactive.  She is conversant.  I see no focal neurologic deficit.  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

## 2023-06-25 NOTE — Progress Notes (Signed)
Refusing chair and bed alarm; fall safety reviewed with patient.

## 2023-06-25 NOTE — Progress Notes (Signed)
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  Spouse concerned that she may be taking extra doses of her medications due to forgetting that she had already taken them.  Admitted for obstructive hydrocephalus.  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  She is not safe to discharge home, needs SNF, neurosurgery cannot perform surgery until next Tuesday due to unavailability of the OR time, patient and her husband has decided to stay in the hospital until surgery is completed.  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  Per PT, she is not safe to discharge home, needs SNF, neurosurgery cannot perform surgery until next Tuesday due to unavailability of the OR time, patient and her husband has decided to stay in the hospital until surgery is completed.  Seizure disorder/supratherapeutic valproate level Valproate level 133 in ED, spouse believes that patient was taking extra doses of her medication due to forgetting that she had already taken it Depakote was briefly held, levels were closely monitored.  Valproate level down to 45 on 1/14.  But repeat on 117 is within normal range. As discussed with neurology, continue prior home maintenance dose.  Resumed valproate.  Patient has been given clear instructions that patient should not take more than the advised dose of valproate and spouse can supervise medications. Pharmacy reviewed home med list and no drug to drug interactions that would cause increase in valproate levels.   Essential hypertension Much better controlled now.  Continue HCTZ 25 Mg daily.  Added lisinopril 20 mg p.o. daily which will help in prevention of diabetic nephropathy as well.  Renal function is normal at the moment.   Newly diagnosed type II DM A1c 6.8. Good inpatient control on NovoLog SSI.   Body mass index is 35.42 kg/m./Obesity Weight loss and diet modification counseled.  DVT prophylaxis: SCDs Start: 06/19/23 2009   Code Status: Full Code  Family Communication: Husband present at bedside.  Plan of care discussed with patient in length and he/she verbalized understanding and agreed with it.  Status is: Inpatient Remains inpatient appropriate because: Patient awaiting neurosurgery next Tuesday, 06/28/2023     Estimated body mass index is 35.42 kg/m as calculated from the following:   Height as  of this encounter: 5\' 3"  (1.6 m).   Weight as of 03/04/23: 90.7 kg.    Nutritional  Assessment: Body mass index is 35.42 kg/m.Marland Kitchen Seen by dietician.  I agree with the assessment and plan as outlined below: Nutrition Status:        . Skin Assessment: I have examined the patient's skin and I agree with the wound assessment as performed by the wound care RN as outlined below:    Consultants:  Neurosurgery  Procedures:  None  Antimicrobials:  Anti-infectives (From admission, onward)    None         Subjective: Patient seen and examined.  Husband the bedside.  Patient much more alert and pleasant.  No complaints.  Objective: Vitals:   06/24/23 2322 06/25/23 0304 06/25/23 0744 06/25/23 1123  BP: 102/86 127/77 135/89 (!) 140/92  Pulse: 83 86 78 99  Resp: 17 15 17 18   Temp: 98.4 F (36.9 C) 98.1 F (36.7 C) 98.1 F (36.7 C) 98.2 F (36.8 C)  TempSrc: Oral Oral Oral Oral  SpO2: 96% 97% 97% 97%  Height:       No intake or output data in the 24 hours ending 06/25/23 1214 There were no vitals filed for this visit.  Examination:  General exam: Appears calm and comfortable  Respiratory system: Clear to auscultation. Respiratory effort normal. Cardiovascular system: S1 & S2 heard, RRR. No JVD, murmurs, rubs, gallops or clicks. No pedal edema. Gastrointestinal system: Abdomen is nondistended, soft and nontender. No organomegaly or masses felt. Normal bowel sounds heard. Central nervous system: Alert and oriented. No focal neurological deficits. Extremities: Symmetric 5 x 5 power. Skin: No rashes, lesions or ulcers.  Psychiatry: Judgement and insight appear normal. Mood & affect appropriate.   Data Reviewed: I have personally reviewed following labs and imaging studies  CBC: Recent Labs  Lab 06/19/23 1850 06/20/23 0350  WBC 6.1 6.9  NEUTROABS 2.6  --   HGB 13.5 14.2  HCT 40.7 44.5  MCV 86.8 88.8  PLT 175 178   Basic Metabolic Panel: Recent Labs  Lab 06/19/23 1850 06/20/23 0350  NA 138 138  K 3.7 3.8  CL 102 103  CO2 25 27  GLUCOSE 91  119*  BUN 13 10  CREATININE 0.75 0.73  CALCIUM 9.0 8.7*   GFR: CrCl cannot be calculated (Unknown ideal weight.). Liver Function Tests: Recent Labs  Lab 06/20/23 0350  AST 38  ALT 34  ALKPHOS 48  BILITOT 0.7  PROT 6.8  ALBUMIN 3.3*   No results for input(s): "LIPASE", "AMYLASE" in the last 168 hours. Recent Labs  Lab 06/20/23 0350  AMMONIA 29   Coagulation Profile: No results for input(s): "INR", "PROTIME" in the last 168 hours. Cardiac Enzymes: No results for input(s): "CKTOTAL", "CKMB", "CKMBINDEX", "TROPONINI" in the last 168 hours. BNP (last 3 results) No results for input(s): "PROBNP" in the last 8760 hours. HbA1C: No results for input(s): "HGBA1C" in the last 72 hours. CBG: Recent Labs  Lab 06/24/23 1130 06/24/23 1559 06/24/23 2149 06/25/23 0612 06/25/23 1124  GLUCAP 143* 141* 140* 107* 133*   Lipid Profile: No results for input(s): "CHOL", "HDL", "LDLCALC", "TRIG", "CHOLHDL", "LDLDIRECT" in the last 72 hours. Thyroid Function Tests: No results for input(s): "TSH", "T4TOTAL", "FREET4", "T3FREE", "THYROIDAB" in the last 72 hours. Anemia Panel: No results for input(s): "VITAMINB12", "FOLATE", "FERRITIN", "TIBC", "IRON", "RETICCTPCT" in the last 72 hours. Sepsis Labs: No results for input(s): "PROCALCITON", "LATICACIDVEN" in the last 168 hours.  No results found  for this or any previous visit (from the past 240 hours).   Radiology Studies: No results found.   Scheduled Meds:  cyanocobalamin  1,000 mcg Oral Daily   divalproex  500 mg Oral BID   hydrochlorothiazide  25 mg Oral Daily   insulin aspart  0-9 Units Subcutaneous TID WC   lisinopril  20 mg Oral Daily   sodium chloride flush  3 mL Intravenous Q12H   Continuous Infusions:   LOS: 2 days   Hughie Closs, MD [facility]  06/25/2023, 12:14 PM   *Please note that this is a verbal dictation therefore any spelling or grammatical errors are due to the "Dragon Medical One" system  interpretation.  Please page via Amion and do not message via secure chat for urgent patient care matters. Secure chat can be used for non urgent patient care matters.  How to contact the [facility] Attending or Consulting provider 7A - 7P or covering provider during after hours 7P -7A, for this patient?  Check the care team in [facility] and look for a) attending/consulting TRH provider listed and b) the [facility] team listed. Page or secure chat 7A-7P. Log into [email or web address] and use [place]'s universal password to access. If you do not have the password, please contact the hospital operator. Locate the [facility] provider you are looking for under [facility] and page to a number that you can be directly reached. If you still have difficulty reaching the provider, please page the [facility] (Director on Call) for the Hospitalists listed on amion for assistance.

## 2023-06-26 DIAGNOSIS — G911 Obstructive hydrocephalus: Secondary | ICD-10-CM | POA: Diagnosis not present

## 2023-06-26 LAB — GLUCOSE, CAPILLARY
Glucose-Capillary: 105 mg/dL — ABNORMAL HIGH (ref 70–99)
Glucose-Capillary: 122 mg/dL — ABNORMAL HIGH (ref 70–99)
Glucose-Capillary: 132 mg/dL — ABNORMAL HIGH (ref 70–99)
Glucose-Capillary: 109 mg/dL — ABNORMAL HIGH (ref 70–99)

## 2023-06-26 NOTE — Plan of Care (Signed)

## 2023-06-26 NOTE — Progress Notes (Signed)
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  Spouse concerned that she may be taking extra doses of her medications due to forgetting that she had already taken them.  Admitted for obstructive hydrocephalus.  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  She is not safe to discharge home, needs SNF, neurosurgery cannot perform surgery until next Tuesday due to unavailability of the OR time, Alison Collins and her husband has decided to stay in the hospital until surgery is completed.  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  Per PT, she is not safe to discharge home, needs SNF, neurosurgery cannot perform surgery until next Tuesday due to unavailability of the OR time, Alison Collins and her husband has decided to stay in the hospital until surgery is completed.  Seizure disorder/supratherapeutic valproate level Valproate level 133 in ED, spouse believes that Alison Collins was taking extra doses of her medication due to forgetting that she had already taken it Depakote was briefly held, levels were closely monitored.  Valproate level down to 45 on 1/14.  But repeat on 117 is within normal range. As discussed with neurology, continue prior home maintenance dose.  Resumed valproate.  Alison Collins has been given clear instructions that Alison Collins should not take more than the advised dose of valproate and spouse can supervise medications. Pharmacy reviewed home med list and no drug to drug interactions that would cause increase in valproate levels.   Essential hypertension Much better controlled now.  Continue HCTZ 25 Mg daily.  Added lisinopril 20 mg p.o. daily which will help in prevention of diabetic nephropathy as well.  Renal function is normal at the moment.   Newly diagnosed type II DM A1c 6.8. Good inpatient control on NovoLog SSI.   Body mass index is 35.42 kg/m./Obesity Weight loss and diet modification counseled.  DVT prophylaxis: SCDs Start: 06/19/23 2009   Code Status: Full Code  Family Communication: None present at bedside.  Plan of care discussed with Alison Collins in length and he/she verbalized understanding and agreed with it.  Status is: Inpatient Remains inpatient appropriate because: Alison Collins awaiting neurosurgery next Tuesday, 06/28/2023     Estimated body mass index is 35.42 kg/m as calculated from the following:   Height as  of this encounter: 5\' 3"  (1.6 m).   Weight as of 03/04/23: 90.7 kg.    Nutritional  Assessment: Body mass index is 35.42 kg/m.Marland Kitchen Seen by dietician.  I agree with the assessment and plan as outlined below: Nutrition Status:        . Skin Assessment: I have examined the Alison Collins's skin and I agree with the wound assessment as performed by the wound care RN as outlined below:    Consultants:  Neurosurgery  Procedures:  None  Antimicrobials:  Anti-infectives (From admission, onward)    None         Subjective: Seen and examined.  No complaints.  She believes that she is improving every day.  Objective: Vitals:   06/25/23 2013 06/25/23 2356 06/26/23 0413 06/26/23 0836  BP: 114/72 124/67 123/76 111/81  Pulse: 82 90 82 99  Resp: 18 18 18    Temp: (!) 97.4 F (36.3 C) 97.6 F (36.4 C) 98.3 F (36.8 C) (!) 97.5 F (36.4 C)  TempSrc: Oral Oral Oral Oral  SpO2: 96% 98% 100% 96%  Height:        Intake/Output Summary (Last 24 hours) at 06/26/2023 1033 Last data filed at 06/25/2023 1600 Gross per 24 hour  Intake 480 ml  Output --  Net 480 ml   There were no vitals filed for this visit.  Examination:  General exam: Appears calm and comfortable  Respiratory system: Clear to auscultation. Respiratory effort normal. Cardiovascular system: S1 & S2 heard, RRR. No JVD, murmurs, rubs, gallops or clicks. No pedal edema. Gastrointestinal system: Abdomen is nondistended, soft and nontender. No organomegaly or masses felt. Normal bowel sounds heard. Central nervous system: Alert and oriented. No focal neurological deficits. Extremities: Symmetric 5 x 5 power. Skin: No rashes, lesions or ulcers.  Psychiatry: Judgement and insight appear normal. Mood & affect appropriate.   Data Reviewed: I have personally reviewed following labs and imaging studies  CBC: Recent Labs  Lab 06/19/23 1850 06/20/23 0350  WBC 6.1 6.9  NEUTROABS 2.6  --   HGB 13.5 14.2  HCT 40.7 44.5  MCV 86.8 88.8  PLT 175 178   Basic Metabolic Panel: Recent Labs  Lab 06/19/23 1850  06/20/23 0350  NA 138 138  K 3.7 3.8  CL 102 103  CO2 25 27  GLUCOSE 91 119*  BUN 13 10  CREATININE 0.75 0.73  CALCIUM 9.0 8.7*   GFR: CrCl cannot be calculated (Unknown ideal weight.). Liver Function Tests: Recent Labs  Lab 06/20/23 0350  AST 38  ALT 34  ALKPHOS 48  BILITOT 0.7  PROT 6.8  ALBUMIN 3.3*   No results for input(s): "LIPASE", "AMYLASE" in the last 168 hours. Recent Labs  Lab 06/20/23 0350  AMMONIA 29   Coagulation Profile: No results for input(s): "INR", "PROTIME" in the last 168 hours. Cardiac Enzymes: No results for input(s): "CKTOTAL", "CKMB", "CKMBINDEX", "TROPONINI" in the last 168 hours. BNP (last 3 results) No results for input(s): "PROBNP" in the last 8760 hours. HbA1C: No results for input(s): "HGBA1C" in the last 72 hours. CBG: Recent Labs  Lab 06/25/23 0612 06/25/23 1124 06/25/23 1609 06/25/23 2127 06/26/23 0625  GLUCAP 107* 133* 140* 128* 105*   Lipid Profile: No results for input(s): "CHOL", "HDL", "LDLCALC", "TRIG", "CHOLHDL", "LDLDIRECT" in the last 72 hours. Thyroid Function Tests: No results for input(s): "TSH", "T4TOTAL", "FREET4", "T3FREE", "THYROIDAB" in the last 72 hours. Anemia Panel: No results for input(s): "VITAMINB12", "FOLATE", "FERRITIN", "TIBC", "IRON", "RETICCTPCT" in the last 72  hours. Sepsis Labs: No results for input(s): "PROCALCITON", "LATICACIDVEN" in the last 168 hours.  No results found for this or any previous visit (from the past 240 hours).   Radiology Studies: No results found.   Scheduled Meds:  cyanocobalamin  1,000 mcg Oral Daily   divalproex  500 mg Oral BID   hydrochlorothiazide  25 mg Oral Daily   insulin aspart  0-9 Units Subcutaneous TID WC   lisinopril  20 mg Oral Daily   sodium chloride flush  3 mL Intravenous Q12H   Continuous Infusions:   LOS: 3 days   Hughie Closs, MD [facility]  06/26/2023, 10:33 AM   *Please note that this is a verbal dictation therefore any  spelling or grammatical errors are due to the "[employer]agon [employer] One" system interpretation.  Please page via Amion and do not message via secure chat for urgent Alison Collins care matters. Secure chat can be used for non urgent Alison Collins care matters.  How to contact the [facility] Attending or Consulting provider 7A - 7P or covering provider during after hours 7P -7A, for this Alison Collins?  Check the care team in [facility] and look for a) attending/consulting [facility] provider listed and b) the [facility] team listed. Page or secure chat 7A-7P. Log into [email or web address] and use [facility]'s universal password to access. If you do not have the password, please contact the hospital operator. Locate the [facility] provider you are looking for under [facility] and page to a number that you can be directly reached. If you still have difficulty reaching the provider, please page the [facility] (Director on Call) for the Hospitalists listed on amion for assistance.

## 2023-06-27 DIAGNOSIS — G911 Obstructive hydrocephalus: Secondary | ICD-10-CM | POA: Diagnosis not present

## 2023-06-27 LAB — GLUCOSE, CAPILLARY
Glucose-Capillary: 106 mg/dL — ABNORMAL HIGH (ref 70–99)
Glucose-Capillary: 78 mg/dL (ref 70–99)
Glucose-Capillary: 127 mg/dL — ABNORMAL HIGH (ref 70–99)
Glucose-Capillary: 121 mg/dL — ABNORMAL HIGH (ref 70–99)

## 2023-06-27 MED ORDER — GUAIFENESIN-DM 100-10 MG/5ML PO SYRP
5.0000 mL | ORAL_SOLUTION | ORAL | Status: DC | PRN
  Administered 2023-06-27: 5 mL via ORAL
  Filled 2023-06-27: qty 5

## 2023-06-27 NOTE — Progress Notes (Signed)
Physical Therapy Treatment Patient Details Name: Alison Collins MRN: [id number] DOB: [birth year] Today's Date: 06/27/2023   History of Present Illness 53 y.o. female who presents 06/19/23 for evaluation of confusion, falls, and hydrocephalus. 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  PMH significant for hypertension, seizures, and recent diagnosis of diabetes mellitus    PT Comments  Pt greeted seated up EOB and agreeable to session with continued progress towards acute mobility goals. Pt continues to be limited by impaired cognition with decreased safety awareness and impulsivity with pt with pt needing near constant cues to attend to task at hand. Pt continues to require grossly min A for transfers and gait with RW for support. Pt with noted L drift in hall needing hands on min assist to correct with pt unable to maintain. Current plan remains appropriate to address deficits and maximize functional independence and safety given no 24/7 supervision. Pt continues to benefit from skilled PT services to progress toward functional mobility goals.     If plan is discharge home, recommend the following: Assist for transportation;Two people to help with walking and/or transfers;Assistance with feeding;Direct supervision/assist for medications management;Direct supervision/assist for financial management;Help with stairs or ramp for entrance;Supervision due to cognitive status   Can travel by private vehicle     No  Equipment Recommendations  Rolling walker (2 wheels)    Recommendations for Other Services       Precautions / Restrictions Precautions Precautions: Fall Precaution Comments: functional status very different 1/14 to 1/15 Restrictions Weight Bearing Restrictions Per Provider Order: No     Mobility  Bed Mobility Overal bed mobility: Needs Assistance             General bed mobility comments: pt sitting up EOB on arrival    Transfers Overall transfer level: Needs  assistance Equipment used: Rolling walker (2 wheels) Transfers: Sit to/from Stand Sit to Stand: Min assist           General transfer comment: pt requires increased cuing for safety and task at hands, max vc for hand placement for power up and safely return to seated    Ambulation/Gait Ambulation/Gait assistance: Min assist Gait Distance (Feet): 200 Feet Assistive device: Rolling walker (2 wheels) Gait Pattern/deviations: Step-through pattern, Decreased step length - right, Decreased step length - left       General Gait Details: pt impulsive and easily requires maximal cuing for safety, keeping UE on walker, keeping attention on task at hand, making larger strides and maintaining center in hall as pt with strong L drift   Stairs             Wheelchair Mobility     Tilt Bed    Modified Rankin (Stroke Patients Only)       Balance Overall balance assessment: Needs assistance, History of Falls Sitting-balance support: No upper extremity supported, Feet supported Sitting balance-Leahy Scale: Poor Sitting balance - Comments: posterior lean   Standing balance support: During functional activity, Bilateral upper extremity supported Standing balance-Leahy Scale: Poor Standing balance comment: requires light assist throughout for maintaining balance                            Cognition Arousal: Alert Behavior During Therapy: Impulsive Overall Cognitive Status: Impaired/Different from baseline Area of Impairment: Orientation, Attention, Memory, Following commands, Safety/judgement, Awareness, Problem solving                 Orientation Level: Disoriented to, Time Current Attention  Level: Selective Memory: Decreased recall of precautions, Decreased short-term memory Following Commands: Follows one step commands inconsistently, Follows one step commands with increased time Safety/Judgement: Decreased awareness of safety, Decreased awareness of  deficits   Problem Solving: Slow processing, Decreased initiation, Difficulty sequencing, Requires verbal cues, Requires tactile cues General Comments: pt's husband reports her cognition is back to baseline, however pt with easily distracted requires constant cuing for safety and use of RW, impulsive attempting to enter other patients room to "visit"        Exercises      General Comments General comments (skin integrity, edema, etc.): Husband present and reports she is at baseline cognition and behavior      Pertinent Vitals/Pain Pain Assessment Pain Assessment: No/denies pain    Home Living                          Prior Function            PT Goals (current goals can now be found in the care plan section) Acute Rehab PT Goals PT Goal Formulation: With patient Time For Goal Achievement: 07/05/23 Progress towards PT goals: Progressing toward goals    Frequency    Min 1X/week      PT Plan      Co-evaluation              AM-PAC PT "6 Clicks" Mobility   Outcome Measure  Help needed turning from your back to your side while in a flat bed without using bedrails?: A Little Help needed moving from lying on your back to sitting on the side of a flat bed without using bedrails?: A Little Help needed moving to and from a bed to a chair (including a wheelchair)?: A Little Help needed standing up from a chair using your arms (e.g., wheelchair or bedside chair)?: A Lot (for cues for safety) Help needed to walk in hospital room?: A Lot (for safety) Help needed climbing 3-5 steps with a railing? : Total 6 Click Score: 14    End of Session Equipment Utilized During Treatment: Gait belt Activity Tolerance: Patient tolerated treatment well (continuest to have cognitive and functional changes but improving) Patient left: with family/visitor present;with call bell/phone within reach;in bed Nurse Communication: Mobility status PT Visit Diagnosis: Unsteadiness on  feet (R26.81);Other abnormalities of gait and mobility (R26.89);History of falling (Z91.81)     Time: 0912-0926 PT Time Calculation (min) (ACUTE ONLY): 14 min  Charges:    $Gait Training: 8-22 mins PT General Charges $$ ACUTE PT VISIT: 1 Visit                     Johathon Overturf R. PTA Acute Rehabilitation Services Office: [phone]   Alison Collins 06/27/2023, 9:34 AM

## 2023-06-27 NOTE — Plan of Care (Signed)
  Problem: Coping: Goal: Ability to adjust to condition or change in health will improve Outcome: Progressing   Problem: Fluid Volume: Goal: Ability to maintain a balanced intake and output will improve Outcome: Progressing   Problem: Metabolic: Goal: Ability to maintain appropriate glucose levels will improve Outcome: Progressing   Problem: Nutritional: Goal: Maintenance of adequate nutrition will improve Outcome: Progressing   Problem: Skin Integrity: Goal: Risk for impaired skin integrity will decrease Outcome: Progressing   Problem: Tissue Perfusion: Goal: Adequacy of tissue perfusion will improve Outcome: Progressing   Problem: Education: Goal: Knowledge of General Education information will improve Description: Including pain rating scale, medication(s)/side effects and non-pharmacologic comfort measures Outcome: Progressing   Problem: Clinical Measurements: Goal: Will remain free from infection Outcome: Progressing Goal: Diagnostic test results will improve Outcome: Progressing Goal: Respiratory complications will improve Outcome: Progressing Goal: Cardiovascular complication will be avoided Outcome: Progressing   Problem: Activity: Goal: Risk for activity intolerance will decrease Outcome: Progressing   Problem: Coping: Goal: Level of anxiety will decrease Outcome: Progressing   Problem: Safety: Goal: Ability to remain free from injury will improve Outcome: Progressing   Problem: Skin Integrity: Goal: Risk for impaired skin integrity will decrease Outcome: Progressing

## 2023-06-27 NOTE — Progress Notes (Signed)
    Providing Compassionate, Quality Care - Together   NEUROSURGERY PROGRESS NOTE     S: No issues overnight.    O: EXAM:  BP (!) (P) 132/94 (BP Location: Right Arm)   Pulse (P) 79   Temp (P) 98.9 F (37.2 C) (Oral)   Resp (P) 18   Ht 5\' 3"  (1.6 m)   LMP 05/16/2017 (Approximate)   SpO2 (P) 98%   BMI 35.42 kg/m     Awake, alert, oriented  Speech fluent, appropriate Sitting at edge of bed comfortably   ASSESSMENT:  53 y.o. with   -worsening hydrocephalus, plan for OR tmrw for ETV.     PLAN: -NPO at midnight -Call w/ questions/concerns.   Patrici Ranks, [facility]

## 2023-06-27 NOTE — Progress Notes (Signed)
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  Spouse concerned that she may be taking extra doses of her medications due to forgetting that she had already taken them.  Admitted for obstructive hydrocephalus.  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  She is not safe to discharge home, needs SNF, neurosurgery cannot perform surgery until next Tuesday due to unavailability of the OR time, patient and her husband has decided to stay in the hospital until surgery is completed.  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  Per PT, she is not safe to discharge home, needs SNF, neurosurgery cannot perform surgery until 06/28/2023.  Patient decided to stay in the hospital.  Plan for surgery tomorrow.  Seizure disorder/supratherapeutic valproate level Valproate level 133 in ED, spouse believes that patient was taking extra doses of her medication due to forgetting that she had already taken it Depakote was briefly held, levels were closely monitored.  Valproate level down to 45 on 1/14.  But repeat on 117 is within normal range. As discussed with neurology, continue prior home maintenance dose.  Resumed valproate.  Patient has been given clear instructions that patient should not take more than the advised dose of valproate and spouse can supervise medications. Pharmacy reviewed home med list and no drug to drug interactions that would cause increase in valproate levels.   Essential hypertension Much better controlled now.  Continue HCTZ 25 Mg daily.  Added lisinopril 20 mg p.o. daily which will help in prevention of diabetic nephropathy as well.  Renal function is normal at the moment.   Newly diagnosed type II DM A1c 6.8. Good inpatient control on NovoLog SSI.   Body mass index is 35.42 kg/m./Obesity Weight loss and diet modification counseled.  DVT prophylaxis: SCDs Start: 06/19/23 2009   Code Status: Full Code  Family Communication: None present at bedside.  Plan of care discussed with patient in length and he/she verbalized understanding and agreed with it.  Status is: Inpatient Remains inpatient appropriate because: Patient awaiting neurosurgery Tuesday, 06/28/2023  Estimated body mass index is 35.42 kg/m as calculated from the following:   Height as of this encounter: 5\' 3"  (1.6 m).   Weight as of 03/04/23: 90.7  kg.    Nutritional Assessment: Body mass index is 35.42 kg/m.Marland Kitchen Seen by dietician.  I agree with  the assessment and plan as outlined below: Nutrition Status:        . Skin Assessment: I have examined the patient's skin and I agree with the wound assessment as performed by the wound care RN as outlined below:    Consultants:  Neurosurgery  Procedures:  None  Antimicrobials:  Anti-infectives (From admission, onward)    None         Subjective: Seen and examined.  Husband at the bedside.  Patient has no complaints.  Objective: Vitals:   06/27/23 0009 06/27/23 0356 06/27/23 0803 06/27/23 1150  BP: 122/78 110/77 (!) 132/94 (!) 106/55  Pulse: 76 77 79 76  Resp: 18 18 18 18   Temp: 98.4 F (36.9 C) 97.9 F (36.6 C) 98.9 F (37.2 C) 98.1 F (36.7 C)  TempSrc: Oral Oral Oral Oral  SpO2: 98% 95% 98% 95%  Height:       No intake or output data in the 24 hours ending 06/27/23 1214  There were no vitals filed for this visit.  Examination:  General exam: Appears calm and comfortable  Respiratory system: Clear to auscultation. Respiratory effort normal. Cardiovascular system: S1 & S2 heard, RRR. No JVD, murmurs, rubs, gallops or clicks. No pedal edema. Gastrointestinal system: Abdomen is nondistended, soft and nontender. No organomegaly or masses felt. Normal bowel sounds heard. Central nervous system: Alert and oriented. No focal neurological deficits. Extremities: Symmetric 5 x 5 power. Skin: No rashes, lesions or ulcers.  Psychiatry: Judgement and insight appear normal. Mood & affect appropriate.    Data Reviewed: I have personally reviewed following labs and imaging studies  CBC: No results for input(s): "WBC", "NEUTROABS", "HGB", "HCT", "MCV", "PLT" in the last 168 hours.  Basic Metabolic Panel: No results for input(s): "NA", "K", "CL", "CO2", "GLUCOSE", "BUN", "CREATININE", "CALCIUM", "MG", "PHOS" in the last 168 hours.  GFR: CrCl cannot be calculated (Unknown ideal weight.). Liver Function Tests: No results for input(s): "AST", "ALT", "ALKPHOS",  "BILITOT", "PROT", "ALBUMIN" in the last 168 hours.  No results for input(s): "LIPASE", "AMYLASE" in the last 168 hours. No results for input(s): "AMMONIA" in the last 168 hours.  Coagulation Profile: No results for input(s): "INR", "PROTIME" in the last 168 hours. Cardiac Enzymes: No results for input(s): "CKTOTAL", "CKMB", "CKMBINDEX", "TROPONINI" in the last 168 hours. BNP (last 3 results) No results for input(s): "PROBNP" in the last 8760 hours. HbA1C: No results for input(s): "HGBA1C" in the last 72 hours. CBG: Recent Labs  Lab 06/26/23 1208 06/26/23 1627 06/26/23 2109 06/27/23 0611 06/27/23 1148  GLUCAP 122* 132* 109* 106* 78   Lipid Profile: No results for input(s): "CHOL", "HDL", "LDLCALC", "TRIG", "CHOLHDL", "LDLDIRECT" in the last 72 hours. Thyroid Function Tests: No results for input(s): "TSH", "T4TOTAL", "FREET4", "T3FREE", "THYROIDAB" in the last 72 hours. Anemia Panel: No results for input(s): "VITAMINB12", "FOLATE", "FERRITIN", "TIBC", "IRON", "RETICCTPCT" in the last 72 hours. Sepsis Labs: No results for input(s): "PROCALCITON", "LATICACIDVEN" in the last 168 hours.  No results found for this or any previous visit (from the past 240 hours).   Radiology Studies: No results found.   Scheduled Meds:  cyanocobalamin  1,000 mcg Oral Daily   divalproex  500 mg Oral BID   hydrochlorothiazide  25 mg Oral Daily   insulin aspart  0-9 Units Subcutaneous TID WC   lisinopril  20 mg Oral Daily   sodium chloride flush  3 mL Intravenous Q12H   Continuous Infusions:   LOS: 4 days   Hughie Closs, MD [facility]  06/27/2023, 12:14 PM   *Please note that this is a verbal dictation therefore any spelling or grammatical errors are due to the "Dragon Medical One" system interpretation.  Please page via Amion and do not message via secure chat for urgent patient care matters. Secure chat can be used for non urgent patient care matters.  How to contact the [facility]  Attending or Consulting provider 7A - 7P or covering provider during after hours 7P -7A, for this patient?  Check the care team in [facility] and look for a) attending/consulting [facility] provider listed and b) the [facility] team listed. Page or secure chat 7A-7P. Log into [email or web address] and use [place] universal password to access. If you do not have the password, please contact the hospital operator. Locate the [facility] provider you are looking for under [facility] and page to a number that you can be directly reached. If you still have difficulty reaching the provider, please page the [facility] (Director on Call) for the Hospitalists listed on amion for assistance.

## 2023-06-28 ENCOUNTER — Other Ambulatory Visit: Payer: Self-pay

## 2023-06-28 ENCOUNTER — Inpatient Hospital Stay (HOSPITAL_COMMUNITY): Payer: Self-pay | Admitting: Anesthesiology

## 2023-06-28 ENCOUNTER — Encounter (HOSPITAL_COMMUNITY)
Admission: EM | Disposition: A | Discharge status: Home / Self Care | Payer: Self-pay | Source: Home / Self Care | Attending: Family Medicine

## 2023-06-28 ENCOUNTER — Inpatient Hospital Stay (HOSPITAL_COMMUNITY)
Admission: RE | Admit: 2023-06-28 | Payer: Commercial Managed Care - HMO | Source: Home / Self Care | Admitting: Neurological Surgery

## 2023-06-28 ENCOUNTER — Encounter (HOSPITAL_COMMUNITY): Payer: Self-pay | Admitting: Family Medicine

## 2023-06-28 DIAGNOSIS — G919 Hydrocephalus, unspecified: Secondary | ICD-10-CM

## 2023-06-28 DIAGNOSIS — I10 Essential (primary) hypertension: Secondary | ICD-10-CM | POA: Diagnosis not present

## 2023-06-28 DIAGNOSIS — F418 Other specified anxiety disorders: Secondary | ICD-10-CM

## 2023-06-28 DIAGNOSIS — E119 Type 2 diabetes mellitus without complications: Secondary | ICD-10-CM

## 2023-06-28 DIAGNOSIS — G911 Obstructive hydrocephalus: Secondary | ICD-10-CM | POA: Diagnosis not present

## 2023-06-28 HISTORY — PX: APPLICATION OF CRANIAL NAVIGATION: SHX6578

## 2023-06-28 LAB — GLUCOSE, CAPILLARY
Glucose-Capillary: 108 mg/dL — ABNORMAL HIGH (ref 70–99)
Glucose-Capillary: 97 mg/dL (ref 70–99)
Glucose-Capillary: 94 mg/dL (ref 70–99)
Glucose-Capillary: 105 mg/dL — ABNORMAL HIGH (ref 70–99)
Glucose-Capillary: 210 mg/dL — ABNORMAL HIGH (ref 70–99)

## 2023-06-28 LAB — ABO/RH: ABO/RH(D): B POS

## 2023-06-28 LAB — MRSA NEXT GEN BY PCR, NASAL: MRSA by PCR Next Gen: NOT DETECTED

## 2023-06-28 LAB — TYPE AND SCREEN
ABO/RH(D): B POS
Antibody Screen: NEGATIVE

## 2023-06-28 SURGERY — ENDOSCOPIC VENTRICULOSTOMY
Anesthesia: General | Site: Head | Laterality: Right

## 2023-06-28 MED ORDER — SUGAMMADEX SODIUM 200 MG/2ML IV SOLN
INTRAVENOUS | Status: DC | PRN
  Administered 2023-06-28: 200 mg via INTRAVENOUS

## 2023-06-28 MED ORDER — MIDAZOLAM HCL 2 MG/2ML IJ SOLN
INTRAMUSCULAR | Status: AC
  Filled 2023-06-28: qty 2

## 2023-06-28 MED ORDER — FLEET ENEMA RE ENEM: 1.0000 | ENEMA | Freq: Once | RECTAL | Status: DC | PRN

## 2023-06-28 MED ORDER — HYDROMORPHONE HCL 1 MG/ML IJ SOLN
INTRAMUSCULAR | Status: AC
  Filled 2023-06-28: qty 0.5

## 2023-06-28 MED ORDER — LIDOCAINE-EPINEPHRINE 1 %-1:100000 IJ SOLN
INTRAMUSCULAR | Status: DC | PRN
  Administered 2023-06-28: 6 mL

## 2023-06-28 MED ORDER — MIDAZOLAM HCL 2 MG/2ML IJ SOLN
INTRAMUSCULAR | Status: DC | PRN
  Administered 2023-06-28: 2 mg via INTRAVENOUS

## 2023-06-28 MED ORDER — PHENYLEPHRINE HCL (PRESSORS) 10 MG/ML IV SOLN
INTRAVENOUS | Status: DC | PRN
  Administered 2023-06-28: 80 ug via INTRAVENOUS

## 2023-06-28 MED ORDER — CHLORHEXIDINE GLUCONATE 0.12 % MT SOLN
15.0000 mL | Freq: Once | OROMUCOSAL | Status: AC
  Administered 2023-06-28: 15 mL via OROMUCOSAL

## 2023-06-28 MED ORDER — ORAL CARE MOUTH RINSE: 15.0000 mL | Freq: Once | OROMUCOSAL | Status: AC

## 2023-06-28 MED ORDER — FENTANYL CITRATE (PF) 250 MCG/5ML IJ SOLN
INTRAMUSCULAR | Status: DC | PRN
  Administered 2023-06-28: 100 ug via INTRAVENOUS
  Administered 2023-06-28: 50 ug via INTRAVENOUS
  Administered 2023-06-28: 100 ug via INTRAVENOUS

## 2023-06-28 MED ORDER — PROMETHAZINE HCL 25 MG PO TABS: 12.5000 mg | ORAL_TABLET | ORAL | Status: DC | PRN

## 2023-06-28 MED ORDER — THROMBIN 5000 UNITS EX SOLR
CUTANEOUS | Status: AC
  Filled 2023-06-28: qty 10000

## 2023-06-28 MED ORDER — POTASSIUM CHLORIDE IN NACL 20-0.9 MEQ/L-% IV SOLN: INTRAVENOUS | Status: DC

## 2023-06-28 MED ORDER — SODIUM CHLORIDE 0.9 % IV SOLN: INTRAVENOUS | Status: DC

## 2023-06-28 MED ORDER — AMISULPRIDE (ANTIEMETIC) 5 MG/2ML IV SOLN: 10.0000 mg | Freq: Once | INTRAVENOUS | Status: DC | PRN

## 2023-06-28 MED ORDER — 0.9 % SODIUM CHLORIDE (POUR BTL) OPTIME
TOPICAL | Status: DC | PRN
  Administered 2023-06-28: 1000 mL

## 2023-06-28 MED ORDER — LIDOCAINE-EPINEPHRINE 1 %-1:100000 IJ SOLN
INTRAMUSCULAR | Status: AC
  Filled 2023-06-28: qty 1

## 2023-06-28 MED ORDER — PROPOFOL 10 MG/ML IV BOLUS
INTRAVENOUS | Status: DC | PRN
  Administered 2023-06-28: 100 mg via INTRAVENOUS

## 2023-06-28 MED ORDER — ROCURONIUM BROMIDE 100 MG/10ML IV SOLN
INTRAVENOUS | Status: DC | PRN
  Administered 2023-06-28: 30 mg via INTRAVENOUS
  Administered 2023-06-28: 70 mg via INTRAVENOUS

## 2023-06-28 MED ORDER — FENTANYL CITRATE (PF) 100 MCG/2ML IJ SOLN: 25.0000 ug | INTRAMUSCULAR | Status: DC | PRN

## 2023-06-28 MED ORDER — ONDANSETRON HCL 4 MG/2ML IJ SOLN
INTRAMUSCULAR | Status: DC | PRN
  Administered 2023-06-28: 4 mg via INTRAVENOUS

## 2023-06-28 MED ORDER — MORPHINE SULFATE (PF) 2 MG/ML IV SOLN: 1.0000 mg | INTRAVENOUS | Status: DC | PRN

## 2023-06-28 MED ORDER — INSULIN ASPART 100 UNIT/ML IJ SOLN
0.0000 [IU] | Freq: Every day | INTRAMUSCULAR | Status: DC
  Administered 2023-06-28: 2 [IU] via SUBCUTANEOUS

## 2023-06-28 MED ORDER — POLYETHYLENE GLYCOL 3350 17 G PO PACK: 17.0000 g | PACK | Freq: Every day | ORAL | Status: DC | PRN

## 2023-06-28 MED ORDER — DOCUSATE SODIUM 100 MG PO CAPS
100.0000 mg | ORAL_CAPSULE | Freq: Two times a day (BID) | ORAL | Status: DC
  Administered 2023-06-28 – 2023-06-29 (×2): 100 mg via ORAL
  Filled 2023-06-28 (×2): qty 1

## 2023-06-28 MED ORDER — LABETALOL HCL 5 MG/ML IV SOLN: 10.0000 mg | INTRAVENOUS | Status: DC | PRN

## 2023-06-28 MED ORDER — ONDANSETRON HCL 4 MG/2ML IJ SOLN: 4.0000 mg | INTRAMUSCULAR | Status: DC | PRN

## 2023-06-28 MED ORDER — LACTATED RINGERS IV SOLN: INTRAVENOUS | Status: DC | PRN

## 2023-06-28 MED ORDER — ONDANSETRON HCL 4 MG/2ML IJ SOLN: 4.0000 mg | Freq: Once | INTRAMUSCULAR | Status: DC | PRN

## 2023-06-28 MED ORDER — FENTANYL CITRATE (PF) 250 MCG/5ML IJ SOLN
INTRAMUSCULAR | Status: AC
  Filled 2023-06-28: qty 5

## 2023-06-28 MED ORDER — THROMBIN 20000 UNITS EX SOLR
CUTANEOUS | Status: AC
  Filled 2023-06-28: qty 20000

## 2023-06-28 MED ORDER — HYDROCODONE-ACETAMINOPHEN 5-325 MG PO TABS
1.0000 | ORAL_TABLET | ORAL | Status: DC | PRN
  Administered 2023-06-28: 1 via ORAL
  Filled 2023-06-28: qty 1

## 2023-06-28 MED ORDER — PHENYLEPHRINE HCL-NACL 20-0.9 MG/250ML-% IV SOLN
INTRAVENOUS | Status: DC | PRN
  Administered 2023-06-28: 50 ug/min via INTRAVENOUS

## 2023-06-28 MED ORDER — PROPOFOL 10 MG/ML IV BOLUS
INTRAVENOUS | Status: AC
  Filled 2023-06-28: qty 20

## 2023-06-28 MED ORDER — DEXAMETHASONE SODIUM PHOSPHATE 10 MG/ML IJ SOLN
INTRAMUSCULAR | Status: DC | PRN
  Administered 2023-06-28: 10 mg via INTRAVENOUS

## 2023-06-28 MED ORDER — CEFAZOLIN SODIUM-DEXTROSE 1-4 GM/50ML-% IV SOLN
INTRAVENOUS | Status: DC | PRN
  Administered 2023-06-28: 2 g via INTRAVENOUS

## 2023-06-28 MED ORDER — HYDROMORPHONE HCL 1 MG/ML IJ SOLN
INTRAMUSCULAR | Status: DC | PRN
  Administered 2023-06-28: .25 mg via INTRAVENOUS

## 2023-06-28 MED ORDER — LIDOCAINE 2% (20 MG/ML) 5 ML SYRINGE
INTRAMUSCULAR | Status: DC | PRN
  Administered 2023-06-28: 100 mg via INTRAVENOUS

## 2023-06-28 MED ORDER — CEFAZOLIN SODIUM-DEXTROSE 2-4 GM/100ML-% IV SOLN
2.0000 g | Freq: Three times a day (TID) | INTRAVENOUS | Status: AC
  Administered 2023-06-28 – 2023-06-29 (×2): 2 g via INTRAVENOUS
  Filled 2023-06-28 (×2): qty 100

## 2023-06-28 MED ORDER — BACITRACIN ZINC 500 UNIT/GM EX OINT
TOPICAL_OINTMENT | CUTANEOUS | Status: DC | PRN
  Administered 2023-06-28: 1 via TOPICAL

## 2023-06-28 MED ORDER — PANTOPRAZOLE SODIUM 40 MG IV SOLR
40.0000 mg | Freq: Every day | INTRAVENOUS | Status: DC
  Administered 2023-06-28: 40 mg via INTRAVENOUS
  Filled 2023-06-28: qty 10

## 2023-06-28 MED ORDER — EPHEDRINE SULFATE-NACL 50-0.9 MG/10ML-% IV SOSY
PREFILLED_SYRINGE | INTRAVENOUS | Status: DC | PRN
  Administered 2023-06-28: 10 mg via INTRAVENOUS

## 2023-06-28 MED ORDER — BACITRACIN ZINC 500 UNIT/GM EX OINT
TOPICAL_OINTMENT | CUTANEOUS | Status: AC
  Filled 2023-06-28: qty 28.35

## 2023-06-28 MED ORDER — CHLORHEXIDINE GLUCONATE CLOTH 2 % EX PADS
6.0000 | MEDICATED_PAD | Freq: Every day | CUTANEOUS | Status: DC
  Administered 2023-06-28: 6 via TOPICAL

## 2023-06-28 MED ORDER — ONDANSETRON HCL 4 MG PO TABS: 4.0000 mg | ORAL_TABLET | ORAL | Status: DC | PRN

## 2023-06-28 MED ORDER — LEVETIRACETAM IN NACL 500 MG/100ML IV SOLN
500.0000 mg | Freq: Two times a day (BID) | INTRAVENOUS | Status: DC
  Administered 2023-06-28: 500 mg via INTRAVENOUS
  Filled 2023-06-28: qty 100

## 2023-06-28 SURGICAL SUPPLY — 62 items
BAG COUNTER SPONGE SURGICOUNT (BAG) ×3 IMPLANT
BLADE SURG 15 STRL LF DISP TIS (BLADE) ×3 IMPLANT
CABLE BIPOLOR RESECTION CORD (MISCELLANEOUS) ×3 IMPLANT
CANISTER SUCT 3000ML PPV (MISCELLANEOUS) ×3 IMPLANT
CANNULA SUCTION SHARP 30D (CANNULA) IMPLANT
CATH EMB 3FR 40 (CATHETERS) IMPLANT
CATH VENTRIC 35X38 W/TROCAR LG (CATHETERS) IMPLANT
COVER BURR HOLE 7 (Orthopedic Implant) IMPLANT
COVER BURR HOLE UNIV 10 (Orthopedic Implant) IMPLANT
COVERAGE SUPPORT O-ARM STEALTH (MISCELLANEOUS) ×2
DRAPE CAMERA VIDEO/LASER (DRAPES) ×3 IMPLANT
DRAPE HALF SHEET 40X57 (DRAPES) ×3 IMPLANT
DRSG AQUACEL AG ADV 3.5X 4 (GAUZE/BANDAGES/DRESSINGS) IMPLANT
DURAPREP 6ML APPLICATOR 50/CS (WOUND CARE) ×3 IMPLANT
ELECT CAUTERY BLADE 6.4 (BLADE) IMPLANT
ELECT REM PT RETURN 9FT ADLT (ELECTROSURGICAL) ×2
ELECTRODE REM PT RTRN 9FT ADLT (ELECTROSURGICAL) ×3 IMPLANT
FEE COVERAGE SUPPORT O-ARM (MISCELLANEOUS) ×3 IMPLANT
GAUZE 4X4 16PLY ~~LOC~~+RFID DBL (SPONGE) ×3 IMPLANT
GAUZE SPONGE 4X4 12PLY STRL (GAUZE/BANDAGES/DRESSINGS) IMPLANT
GLOVE BIO SURGEON STRL SZ7 (GLOVE) ×12 IMPLANT
GLOVE BIOGEL PI IND STRL 7.5 (GLOVE) ×9 IMPLANT
GLOVE ECLIPSE 7.5 STRL STRAW (GLOVE) ×3 IMPLANT
GOWN STRL REUS W/ TWL LRG LVL3 (GOWN DISPOSABLE) ×3 IMPLANT
GOWN STRL REUS W/ TWL XL LVL3 (GOWN DISPOSABLE) ×6 IMPLANT
GOWN STRL REUS W/TWL 2XL LVL3 (GOWN DISPOSABLE) IMPLANT
INTRODUCER ENDO MINOP 6.1X6 (INTRODUCER) IMPLANT
IV NS 1000ML BAXH (IV SOLUTION) ×3 IMPLANT
KIT BASIN OR (CUSTOM PROCEDURE TRAY) ×3 IMPLANT
KIT DRAIN CSF ACCUDRAIN (MISCELLANEOUS) IMPLANT
KIT TURNOVER KIT B (KITS) ×3 IMPLANT
MARKER SPHERE PSV REFLC NDI (MISCELLANEOUS) ×9 IMPLANT
NDL HYPO 18GX1.5 BLUNT FILL (NEEDLE) IMPLANT
NDL HYPO 25X1 1.5 SAFETY (NEEDLE) ×3 IMPLANT
NEEDLE HYPO 18GX1.5 BLUNT FILL (NEEDLE)
NEEDLE HYPO 25X1 1.5 SAFETY (NEEDLE) ×2
NS IRRIG 1000ML POUR BTL (IV SOLUTION) ×3 IMPLANT
PACK EENT II TURBAN DRAPE (CUSTOM PROCEDURE TRAY) ×3 IMPLANT
PAD ARMBOARD 7.5X6 YLW CONV (MISCELLANEOUS) ×3 IMPLANT
PENCIL BUTTON HOLSTER BLD 10FT (ELECTRODE) IMPLANT
PERFORATOR LRG 14-11MM (BIT) IMPLANT
SCREW UNIII AXS SD 1.5X4 (Screw) IMPLANT
SET POST CRANIOTOMY SUBDURAL (MISCELLANEOUS) IMPLANT
SPIKE FLUID TRANSFER (MISCELLANEOUS) ×3 IMPLANT
SPONGE SURGIFOAM ABS GEL 100 (HEMOSTASIS) IMPLANT
STOPCOCK 4 WAY LG BORE MALE ST (IV SETS) IMPLANT
SUT ETHILON 3 0 PS 1 (SUTURE) IMPLANT
SUT MNCRL AB 4-0 PS2 18 (SUTURE) IMPLANT
SUT SILK 2 0 PERMA HAND 18 BK (SUTURE) ×3 IMPLANT
SUT VIC AB 2-0 CT2 18 VCP726D (SUTURE) IMPLANT
SUT VIC AB 3-0 SH 8-18 (SUTURE) IMPLANT
SUT VICRYL RAPIDE 3/0 (SUTURE) IMPLANT
SYR 3ML LL SCALE MARK (SYRINGE) ×9 IMPLANT
SYR 50ML LL SCALE MARK (SYRINGE) IMPLANT
SYR BULB EAR ULCER 3OZ GRN STR (SYRINGE) ×3 IMPLANT
SYR CONTROL 10ML LL (SYRINGE) ×3 IMPLANT
TOWEL GREEN STERILE (TOWEL DISPOSABLE) ×3 IMPLANT
TOWEL GREEN STERILE FF (TOWEL DISPOSABLE) ×3 IMPLANT
TUBE CONNECTING 12X1/4 (SUCTIONS) IMPLANT
TUBING EXTENTION W/L.L. (IV SETS) IMPLANT
UNDERPAD 30X36 HEAVY ABSORB (UNDERPADS AND DIAPERS) IMPLANT
WATER STERILE IRR 1000ML POUR (IV SOLUTION) ×3 IMPLANT

## 2023-06-28 NOTE — Transfer of Care (Signed)
Immediate Anesthesia Transfer of Care Note  Patient: Alison Collins  Procedure(s) Performed: Endoscopic Third Ventriculostomy (Right: Head) APPLICATION OF CRANIAL NAVIGATION (Right)  Patient Location: PACU  Anesthesia Type:General  Level of Consciousness: sedated  Airway & Oxygen Therapy: Patient Spontanous Breathing  Post-op Assessment: Report given to RN  Post vital signs: Reviewed and stable  Last Vitals:  Vitals Value Taken Time  BP 142/92 06/28/23 1545  Temp    Pulse 102 06/28/23 1548  Resp 19 06/28/23 1548  SpO2 91 % 06/28/23 1548  Vitals shown include unfiled device data.  Last Pain:  Vitals:   06/28/23 1223  TempSrc: Oral  PainSc: 0-No pain      Patients Stated Pain Goal: 0 (06/24/23 0817)  Complications: No notable events documented.

## 2023-06-28 NOTE — Consult Note (Signed)
NAME:  Alison Collins, MRN:  [id number], DOB:  March 12, 1971, LOS: 5 ADMISSION DATE:  06/19/2023 CONSULTATION DATE:  06/28/2023 REFERRING MD:  Maisie Fus - NSGY, CHIEF COMPLAINT:  Postoperative medical management   History of Present Illness:  53 year old woman who presented to [facility] 1/12 for changes in gait, confusion, and known hydrocephalus. PMHx significant for HTN, T2DM, anemia, migraines, seizures.  Patient initially presented to hospital in [place] 12/29 with AMS, dizziness and nausea. COVID+ and was treated with remdesivir. In the setting of AMS, CT Head was obtained demonstrating hydrocephalus. MRI Brain showed noncommunicating hydrocephalus with apparent obstruction at the level of the cerebral aqueduct. NSGY was consulted and recommended outpatient follow up. Patient presented to [facility] 1/12 with waxing/waning confusion, balance/gait difficulties and falls. There is some report of possible extra doses of medication due to forgetting medications had already been taken.   On ED arrival, patient was afebrile, normotensive and protecting her airway. VPA level 133. NSGY was reconsulted and repeat imaging demonstrated known hydrocephalus with worsening clinical symptoms and slightly worsening ventricular enlargement. CSF diversion was considered with recommendation for ETV. Patient subsequently underwent ETV with NSGY (Dr. Maisie Fus) 1/21. Intraoperative course was uncomplicated with 5mL EBL. Patient was transferred to 4N ICU in stable condition.  PCCM consulted for postoperative medical management.  Pertinent Medical History:   Past Medical History:  Diagnosis Date   Anemia    GDM (gestational diabetes mellitus)    Hypertension    Migraine    Non-insulin dependent type 2 diabetes mellitus (HCC) 06/19/2023   Normal spontaneous vaginal delivery    Seizures (HCC)    Spontaneous abortion    2   Significant Hospital Events: Including procedures, antibiotic start and stop dates in addition to other pertinent  events   1/12 - Admitted for AMS, dizziness/nausea, gait disturbance, falls. CT +hydrocephalus. NSGY consulted. 1/15 - Initial plan for discharge home with OP follow up, worsened gait/balance issues, discharge cancelled. NSGY reconsulted. 1/21 - Taken to OR for ETV with NSGY (Dr. Maisie Fus). Tolerated procedure well. Transferred to 4N ICU postoperatively.   Interim History / Subjective:  PCCM consulted for postoperative medical management.  Objective:  Blood pressure (!) 130/93, pulse 95, temperature 97.7 F (36.5 C), temperature source Oral, resp. rate 20, height 5\' 3"  (1.6 m), last menstrual period 05/16/2017, SpO2 94%.        Intake/Output Summary (Last 24 hours) at 06/28/2023 1912 Last data filed at 06/28/2023 1518 Gross per 24 hour  Intake 603 ml  Output 10 ml  Net 593 ml   There were no vitals filed for this visit.  Physical Examination: General: Acutely ill-appearing middle-aged woman in NAD. Pleasant and conversant, but remains drowsy. HEENT: Anicteric sclera, PERRL 3mm, moist mucous membranes. Surgical dressing in place, clean/dry/intact with scant strikethrough. Neuro: Awake, oriented x 4. Feels mildly drowsy. Responds to verbal stimuli. Following commands consistently. Moves all 4 extremities spontaneously. Strength 5/5 in all 4 extremities.  CV: RRR, no m/g/r. PULM: Breathing even and unlabored on 3LNC. Lung fields CTAB, in upper fields, diminished at bilateral bases. GI: Soft, nontender, nondistended. Normoactive bowel sounds. Extremities: No LE edema noted. Skin: Warm/dry, no rashes.  Resolved Hospital Problem List:    Assessment & Plan:  Obstructing hydrocephalus S/p endoscopic third ventriculostomy (ETV) History of seizure - Admitted to ICU post-procedure - Postoperative management per NSGY (Primary Team) - AEDs per Neurosurgery, on VPA - Multimodal pain control (APAP, Norco, morphine) - Neuroprotective measures: HOB > 30 degrees, normoglycemia, normothermia,  electrolytes WNL  HTN Home medications: hydrochlorothiazide 25mg  daily. - Goal SBP < 160 - Resume home medications - Hydralazine/labetalol PRN  T2DM Hemoglobin A1c 6.8%. - SSI - CBGs ACHS - Goal CBG 140-180  Best Practice: (right click and "Reselect all SmartList Selections" daily)   Diet/type: Regular consistency (see orders) DVT prophylaxis: SCDs, ppx per NSGY GI prophylaxis: N/A Lines: N/A Foley:  N/A Code Status:  full code Last date of multidisciplinary goals of care discussion [Per Primary Team]  Labs:  CBC: No results for input(s): "WBC", "NEUTROABS", "HGB", "HCT", "MCV", "PLT" in the last 168 hours.  Basic Metabolic Panel: No results for input(s): "NA", "K", "CL", "CO2", "GLUCOSE", "BUN", "CREATININE", "CALCIUM", "MG", "PHOS" in the last 168 hours. GFR: CrCl cannot be calculated (Unknown ideal weight.). No results for input(s): "PROCALCITON", "WBC", "LATICACIDVEN" in the last 168 hours.  Liver Function Tests: No results for input(s): "AST", "ALT", "ALKPHOS", "BILITOT", "PROT", "ALBUMIN" in the last 168 hours. No results for input(s): "LIPASE", "AMYLASE" in the last 168 hours. No results for input(s): "AMMONIA" in the last 168 hours.  ABG:    Component Value Date/Time   TCO2 25 09/10/2008 2230    Coagulation Profile: No results for input(s): "INR", "PROTIME" in the last 168 hours.  Cardiac Enzymes: No results for input(s): "CKTOTAL", "CKMB", "CKMBINDEX", "TROPONINI" in the last 168 hours.  HbA1C: Hgb A1c MFr Bld  Date/Time Value Ref Range Status  06/20/2023 09:05 AM 6.8 (H) 4.8 - 5.6 % Final    Comment:    (NOTE) Pre diabetes:          5.7%-6.4%  Diabetes:              >6.4%  Glycemic control for   <7.0% adults with diabetes    CBG: Recent Labs  Lab 06/27/23 2137 06/28/23 0613 06/28/23 1124 06/28/23 1316 06/28/23 1546  GLUCAP 121* 108* 97 94 105*   Review of Systems:   Review of Systems  Constitutional:  Positive for malaise/fatigue.  Negative for chills, fever and weight loss.  HENT:  Negative for congestion and sore throat.   Eyes:  Negative for blurred vision, double vision and photophobia.  Respiratory:  Negative for cough, hemoptysis, sputum production and shortness of breath.   Cardiovascular:  Negative for chest pain, palpitations and leg swelling.  Gastrointestinal:  Negative for abdominal pain, diarrhea, nausea and vomiting.  Genitourinary:  Negative for dysuria.  Neurological:  Negative for dizziness, focal weakness and headaches.   Past Medical History:  She,  has a past medical history of Anemia, GDM (gestational diabetes mellitus), Hypertension, Migraine, Non-insulin dependent type 2 diabetes mellitus (HCC) (06/19/2023), Normal spontaneous vaginal delivery, Seizures (HCC), and Spontaneous abortion.   Surgical History:   Past Surgical History:  Procedure Laterality Date   BREAST SURGERY     LIPOSUCTION      Social History:   reports that she has never smoked. She has never used smokeless tobacco. She reports current alcohol use. She reports that she does not use drugs.   Family History:  Her family history includes Cancer in her mother; Heart disease in her father; Hypertension in her mother; Seizures in her sister.   Allergies: Allergies  Allergen Reactions   Gluten Meal    Penicillins Swelling    Pt states medication gives her bruises, no SOB or hives/itching. Childhood reaction  Has patient had a PCN reaction causing immediate rash, facial/tongue/throat swelling, SOB or lightheadedness with hypotension: Yes Has patient had a PCN reaction causing severe rash involving mucus membranes  or skin necrosis: No Has patient had a PCN reaction that required hospitalization:No Has patient had a PCN reaction occurring within the last 10 years: /No If all of the above answers are "NO", then may proceed    Home Medications: Prior to Admission medications   Medication Sig Start Date End Date Taking?  Authorizing Provider  Cyanocobalamin (VITAMIN B 12) 500 MCG TABS Take 1 tablet by mouth daily.   Yes [provider]  divalproex (DEPAKOTE) 500 MG DR tablet Take 1 tablet (500 mg total) by mouth 2 (two) times daily. 05/14/23  Yes Jacalyn Lefevre, MD  hydrochlorothiazide (HYDRODIURIL) 25 MG tablet Take 1 tablet (25 mg total) by mouth daily. 05/14/23  Yes Jacalyn Lefevre, MD  lisinopril (ZESTRIL) 20 MG tablet Take 1 tablet (20 mg total) by mouth daily. 06/22/23 06/21/24 Yes Hughie Closs, MD  Magnesium 300 MG CAPS Take 1 capsule by mouth daily at 6 (six) AM.   Yes [provider]  metFORMIN (GLUCOPHAGE) 500 MG tablet Take 500 mg by mouth 2 (two) times daily with a meal. 06/06/23 07/06/23 Yes [provider]   Critical care time: N/A   Tim Lair, PA-C [facility] 06/28/23 7:12 PM  Please see [email or web address] for pager details.  From 7A-7P if no response, please call [phone] After hours, please call ELink [phone]

## 2023-06-28 NOTE — Progress Notes (Signed)
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  Spouse concerned that she may be taking extra doses of her medications due to forgetting that she had already taken them.  Admitted for obstructive hydrocephalus.  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  She is not safe to discharge home, needs SNF, neurosurgery cannot perform surgery until next Tuesday due to unavailability of the OR time, patient and her husband has decided to stay in the hospital until surgery is completed.  Plan for surgery later today.  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  Per PT, she is not safe to discharge home, needs SNF, neurosurgery cannot perform surgery until 06/28/2023.  Patient decided to stay in the hospital.  Plan for surgery tomorrow.  Seizure disorder/supratherapeutic valproate level Valproate level 133 in ED, spouse believes that patient was taking extra doses of her medication due to forgetting that she had already taken it Depakote was briefly held, levels were closely monitored.  Valproate level down to 45 on 1/14.  But repeat on 117 is within normal range. As discussed with neurology, continue prior home maintenance dose.  Resumed valproate.  Patient has been given clear instructions that patient should not take more than the advised dose of valproate and spouse can supervise medications. Pharmacy reviewed home med list and no drug to drug interactions that would cause increase in valproate levels.   Essential hypertension Much better controlled now.  Continue HCTZ 25 Mg daily.  Added lisinopril 20 mg p.o. daily which will help in prevention of diabetic nephropathy as well.  Renal function is normal at the moment.   Newly diagnosed type II DM A1c 6.8. Good inpatient control on NovoLog SSI.   Body mass index is 35.42 kg/m./Obesity Weight loss and diet modification counseled.  DVT prophylaxis: SCD's Start: 06/28/23 0645 SCDs Start: 06/19/23 2009   Code Status: Full Code  Family Communication: Husband present at bedside.  Plan of care discussed with patient in length and he/she verbalized understanding and agreed with it.  Status is: Inpatient Remains inpatient appropriate because: Patient awaiting neurosurgery Tuesday, 06/28/2023  Estimated body mass index is 35.42 kg/m as calculated from the following:   Height as of this encounter: 5'  3" (1.6 m).   Weight as of 03/04/23: 90.7 kg.    Nutritional  Assessment: Body mass index is 35.42 kg/m.Marland Kitchen Seen by dietician.  I agree with the assessment and plan as outlined below: Nutrition Status:        . Skin Assessment: I have examined the patient's skin and I agree with the wound assessment as performed by the wound care RN as outlined below:    Consultants:  Neurosurgery  Procedures:  None  Antimicrobials:  Anti-infectives (From admission, onward)    None         Subjective: Patient seen and examined.  No complaints.  Husband at the bedside.  Objective: Vitals:   06/27/23 2351 06/28/23 0419 06/28/23 0806 06/28/23 1040  BP: 131/70 133/67 138/71 122/82  Pulse: 64 76 66 70  Resp: 16 16 17 16   Temp: (!) 97.5 F (36.4 C) 97.8 F (36.6 C) 97.7 F (36.5 C) 99 F (37.2 C)  TempSrc: Oral Oral Oral Oral  SpO2: 91% 93% 99% 98%  Height:        Intake/Output Summary (Last 24 hours) at 06/28/2023 1056 Last data filed at 06/28/2023 1043 Gross per 24 hour  Intake 123 ml  Output --  Net 123 ml    There were no vitals filed for this visit.  Examination:  General exam: Appears calm and comfortable  Respiratory system: Clear to auscultation. Respiratory effort normal. Cardiovascular system: S1 & S2 heard, RRR. No JVD, murmurs, rubs, gallops or clicks. No pedal edema. Gastrointestinal system: Abdomen is nondistended, soft and nontender. No organomegaly or masses felt. Normal bowel sounds heard. Central nervous system: Alert and oriented. No focal neurological deficits. Extremities: Symmetric 5 x 5 power. Skin: No rashes, lesions or ulcers.  Psychiatry: Judgement and insight appear normal. Mood & affect appropriate.   Data Reviewed: I have personally reviewed following labs and imaging studies  CBC: No results for input(s): "WBC", "NEUTROABS", "HGB", "HCT", "MCV", "PLT" in the last 168 hours.  Basic Metabolic Panel: No results for input(s): "NA", "K", "CL", "CO2", "GLUCOSE", "BUN", "CREATININE", "CALCIUM", "MG", "PHOS"  in the last 168 hours.  GFR: CrCl cannot be calculated (Unknown ideal weight.). Liver Function Tests: No results for input(s): "AST", "ALT", "ALKPHOS", "BILITOT", "PROT", "ALBUMIN" in the last 168 hours.  No results for input(s): "LIPASE", "AMYLASE" in the last 168 hours. No results for input(s): "AMMONIA" in the last 168 hours.  Coagulation Profile: No results for input(s): "INR", "PROTIME" in the last 168 hours. Cardiac Enzymes: No results for input(s): "CKTOTAL", "CKMB", "CKMBINDEX", "TROPONINI" in the last 168 hours. BNP (last 3 results) No results for input(s): "PROBNP" in the last 8760 hours. HbA1C: No results for input(s): "HGBA1C" in the last 72 hours. CBG: Recent Labs  Lab 06/27/23 0611 06/27/23 1148 06/27/23 1646 06/27/23 2137 06/28/23 0613  GLUCAP 106* 78 127* 121* 108*   Lipid Profile: No results for input(s): "CHOL", "HDL", "LDLCALC", "TRIG", "CHOLHDL", "LDLDIRECT" in the last 72 hours. Thyroid Function Tests: No results for input(s): "TSH", "T4TOTAL", "FREET4", "T3FREE", "THYROIDAB" in the last 72 hours. Anemia Panel: No results for input(s): "VITAMINB12", "FOLATE", "FERRITIN", "TIBC", "IRON", "RETICCTPCT" in the last 72 hours. Sepsis Labs: No results for input(s): "PROCALCITON", "LATICACIDVEN" in the last 168 hours.  No results found for this or any previous visit (from the past 240 hours).   Radiology Studies: No results found.   Scheduled Meds:  cyanocobalamin  1,000 mcg Oral Daily   divalproex  500 mg Oral BID   hydrochlorothiazide  25 mg Oral Daily   insulin aspart  0-9 Units Subcutaneous TID WC   lisinopril  20 mg Oral Daily   sodium chloride flush  3 mL Intravenous Q12H   Continuous Infusions:   LOS: 5 days   Hughie Closs, MD [facility]  06/28/2023, 10:56 AM   *Please note that this is a verbal dictation therefore any spelling or grammatical errors are due to the "Dragon Medical One" system interpretation.  Please page via  Amion and do not message via secure chat for urgent patient care matters. Secure chat can be used for non urgent patient care matters.  How to contact the [facility] Attending or Consulting provider 7A - 7P or covering provider during after hours 7P -7A, for this patient?  Check the care team in [facility] and look for a) attending/consulting TR[facility] provider listed and b) the [facility] team listed. Page or secure chat 7A-7P. Log into [email or web address] and use [place]'s universal password to access. If you do not have the password, please contact the hospital operator. Locate the [facility] provider you are looking for under [facility] and page to a number that you can be directly reached. If you still have difficulty reaching the provider, please page the [facility] (Director on Call) for the Hospitalists listed on amion for assistance.

## 2023-06-28 NOTE — Op Note (Signed)
Procedure(s): Endoscopic Third Ventriculostomy APPLICATION OF CRANIAL NAVIGATION Procedure Note  Alison Collins female 53 y.o. 06/28/2023  Procedure(s) and Anesthesia Type:    * Endoscopic Third Ventriculostomy - General    * APPLICATION OF CRANIAL NAVIGATION - General  Surgeons and Role:    Maisie Fus, Coy Saunas, MD - Primary   Indications:  This is a 53yo female with chronic hydrocephalus who developed progressively worsening gait dysfunction and slowly increasing ventricular size.  Her gait dysfunction worsened to the point that she was nonambulatory.  Given the obstructive pattern of hydrocephalus with ventriculomegaly of lateral and 3rd ventricle, she was not a candidate for diagnostic large volume LP.  With her worsening symptoms, I discussed with her and her family options of ETV or VP shunting.   They strongly preferred the ETV option.   I discussed in detail the procedure with husband and patient.  Possibility of craniectomy, ventricular or lumbar drain placement, and central line placement was also discussed. Risks, benefits, alternatives, and expected convalescence was discussed.  Risks discussed included but were not limited to bleeding, pain, infection, seizure, stroke, scar, recurrence, cerebrospinal fluid leak, neurologic deficit, hypopituitarism, stroke, paralysis, coma and death.  I also discussed with her the possibility of blood transfusion during surgery and consent to transfuse blood products if necessary was also obtained.  All questions and concerns were answered and understanding and agreement with the plan was verbalized.    Surgeon: Bedelia Person   Assistants: Patrici Ranks, PA.  Please note no qualified trainees were available to assist with the procedure.  Assistance was required through the entirety of the procedure.   Anesthesia: General endotracheal anesthesia   Procedure Detail  Endoscopic Third Ventriculostomy Use of neuronavigation  The patient  was brought to the operating room.  General anesthesia was induced and patient was intubated by the anesthesia service.  After appropriate lines and monitors placed, patient was placed supine and head was placed in a Mayfield head holder and affixed to the bed.  Preoperative thin cut CT scan was reconstructed into 3D image.  This allowed for surface registration with the Medtronic Stealth system.  Using neuronavigation, an entry point at the coronal suture approximately 2.5 cm off of midline on the right side of the scalp was planned.  A small area of the scalp was clipped, preprepped with alcohol and prepped and draped in sterile fashion.  1% lidocaine with epinephrine was injected in the skin.  A timeout was performed.  Preoperative antibiotics were given.  Semilunar incision was made with a 10 blade and galea was opened sharply.  Skin was flapped backwards.  Periosteum was reflected from the skull.  Bur hole was placed and the dura was coagulated.  A cruciate incision in the dura was made.  Erie Noe was bipolared and cut.  Using neuronavigation, peel-away sheath was placed in the right frontal horn in line with the foramen of Monro and 2% area.  Endoscope with working channel was then passed into the ventricle.  The third ventricle was entered through the foramen of Monro and the floor the third ventricle was inspected.  There appeared to be a excellent area for ventriculostomy of thin semitranslucent tissue between the mamillary bodies and well behind the infundibular recess.  Blunt probe was used to open the tuber scenario and the prepontine cistern was opened.  The basilar apex was seen posterior to the opening.  3 French Fogarty balloon was then used to enlarge the opening.  No bleeding was encountered.  Was then passed into the prepontine cistern and no significant adhesions was seen.  There was good communication between the third ventricle and the cisterns.  The endoscope was then withdrawn and fornix was  inspected and appeared undisturbed.  Ventricular fluid remained clear.  The peel-away sheath was then removed with no significant bleeding noted and T shaped piece of Gelfoam was used to plug the corticotomy.  Cranial plate was used to cover the bur hole.  Scalp was closed with 2-0 Vicryl stitches in buried interrupted fashion followed by staples and a sterile dressing.  Patient was removed from the Mayfield head holder with the plan of extubation by the anesthesia service.  All counts were correct at the end of surgery.  No complications were noted.  Findings: Successful fenestration of the tuber cinereum  Estimated Blood Loss:  5 ml         Drains: none  Blood Given: none          Specimens: none         Implants: Stryker cranial plate        Complications:  * No complications entered in OR log *         Disposition: PACU - hemodynamically stable.         Condition: stable

## 2023-06-28 NOTE — Progress Notes (Signed)
Subjective: NAEs o/n  Objective: Vital signs in last 24 hours: Temp:  [97.5 F (36.4 C)-99 F (37.2 C)] 97.9 F (36.6 C) (01/21 1223) Pulse Rate:  [64-76] 69 (01/21 1223) Resp:  [16-18] 18 (01/21 1223) BP: (111-143)/(66-82) 143/78 (01/21 1223) SpO2:  [91 %-99 %] 96 % (01/21 1223)  Intake/Output from previous day: 01/20 0701 - 01/21 0700 In: 240 [P.O.:240] Out: -  Intake/Output this shift: Total I/O In: 3 [I.V.:3] Out: -   NAD Awake, alert FC x 4  Lab Results: No results for input(s): "WBC", "HGB", "HCT", "PLT" in the last 72 hours. BMET No results for input(s): "NA", "K", "CL", "CO2", "GLUCOSE", "BUN", "CREATININE", "CALCIUM" in the last 72 hours.  Studies/Results: No results found.  Assessment/Plan: 53 yo F with hydrocephalus and increasing gait dysfunction.  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  Risks discussed included but were not limited to, bleeding, pain, infection, scar, spinal fluid leak, pseudomeningocele, persistent hydrocephalus, hypopituitarism, neurologic deficit, nausea, vomiting, stroke, and death.  Informed consent was obtained.    Alison Collins 06/28/2023, 12:46 PM

## 2023-06-28 NOTE — Progress Notes (Signed)
Transported off unit to Short Stay for OR. Pre-op checklist completed.  SCD sleeves on patient. MRSA PCR sent to lab.  CHG bath completed. Family collected all belongings and sent with patient/family off the unit.

## 2023-06-28 NOTE — Anesthesia Preprocedure Evaluation (Addendum)
Anesthesia Evaluation  Patient identified by MRN, date of birth, ID band Patient awake    Reviewed: Allergy & Precautions, NPO status , Patient's Chart, lab work & pertinent test results  Airway Mallampati: III  TM Distance: >3 FB Neck ROM: Full    Dental  (+) Teeth Intact, Dental Advisory Given   Pulmonary neg pulmonary ROS   Pulmonary exam normal breath sounds clear to auscultation       Cardiovascular hypertension, Pt. on medications Normal cardiovascular exam Rhythm:Regular Rate:Normal     Neuro/Psych  Headaches, Seizures -,  PSYCHIATRIC DISORDERS Anxiety Depression    Hydrocephalus     GI/Hepatic negative GI ROS, Neg liver ROS,,,  Endo/Other  diabetes, Type 2, Oral Hypoglycemic Agents  Class 3 obesity  Renal/GU negative Renal ROS     Musculoskeletal negative musculoskeletal ROS (+)    Abdominal   Peds  Hematology negative hematology ROS (+)   Anesthesia Other Findings Day of surgery medications reviewed with the patient.  Reproductive/Obstetrics                             Anesthesia Physical Anesthesia Plan  ASA: 3  Anesthesia Plan: General   Post-op Pain Management: Ofirmev IV (intra-op)*   Induction: Intravenous  PONV Risk Score and Plan: 3 and Dexamethasone, Ondansetron and Midazolam  Airway Management Planned: Oral ETT  Additional Equipment:   Intra-op Plan:   Post-operative Plan: Extubation in OR  Informed Consent: I have reviewed the patients History and Physical, chart, labs and discussed the procedure including the risks, benefits and alternatives for the proposed anesthesia with the patient or authorized representative who has indicated his/her understanding and acceptance.     Dental advisory given  Plan Discussed with: CRNA  Anesthesia Plan Comments:        Anesthesia Quick Evaluation

## 2023-06-29 ENCOUNTER — Inpatient Hospital Stay (HOSPITAL_COMMUNITY): Payer: Commercial Managed Care - HMO

## 2023-06-29 DIAGNOSIS — G919 Hydrocephalus, unspecified: Secondary | ICD-10-CM

## 2023-06-29 DIAGNOSIS — E119 Type 2 diabetes mellitus without complications: Secondary | ICD-10-CM | POA: Diagnosis not present

## 2023-06-29 LAB — GLUCOSE, CAPILLARY
Glucose-Capillary: 126 mg/dL — ABNORMAL HIGH (ref 70–99)
Glucose-Capillary: 110 mg/dL — ABNORMAL HIGH (ref 70–99)

## 2023-06-29 LAB — BASIC METABOLIC PANEL
Anion gap: 10 (ref 5–15)
BUN: 22 mg/dL — ABNORMAL HIGH (ref 6–20)
CO2: 24 mmol/L (ref 22–32)
Calcium: 8.7 mg/dL — ABNORMAL LOW (ref 8.9–10.3)
Chloride: 99 mmol/L (ref 98–111)
Creatinine, Ser: 0.98 mg/dL (ref 0.44–1.00)
GFR, Estimated: >60 mL/min (ref >60)
Glucose, Bld: 139 mg/dL — ABNORMAL HIGH (ref 70–99)
Potassium: 3.7 mmol/L (ref 3.5–5.1)
Sodium: 133 mmol/L — ABNORMAL LOW (ref 135–145)

## 2023-06-29 LAB — CBC
HCT: 37.5 % (ref 36.0–46.0)
Hemoglobin: 12.7 g/dL (ref 12.0–15.0)
MCH: 28.5 pg (ref 26.0–34.0)
MCHC: 33.9 g/dL (ref 30.0–36.0)
MCV: 84.3 fL (ref 80.0–100.0)
Platelets: 223 10*3/uL (ref 150–400)
RBC: 4.45 MIL/uL (ref 3.87–5.11)
RDW: 15.1 % (ref 11.5–15.5)
WBC: 11.4 10*3/uL — ABNORMAL HIGH (ref 4.0–10.5)
nRBC: 0 % (ref 0.0–0.2)

## 2023-06-29 LAB — MAGNESIUM: Magnesium: 1.9 mg/dL (ref 1.7–2.4)

## 2023-06-29 LAB — PHOSPHORUS: Phosphorus: 3.6 mg/dL (ref 2.5–4.6)

## 2023-06-29 NOTE — Progress Notes (Signed)
Physical Therapy Treatment Patient Details Name: Alison Collins MRN: [id number] DOB: 1970/06/18 Today's Date: 06/29/2023   History of Present Illness 53 y.o. female who presents 06/19/23 for evaluation of confusion, falls, and hydrocephalus. 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  Underwent ETV with neurosurgery 1/21. PMH significant for hypertension, seizures, and recent diagnosis of diabetes mellitus    PT Comments  Pt underwent ETV with neurosurgery yesterday, 1/21. Today she is doing well. Cognitively she appears to be at baseline. She required min assist bed mobility, min assist transfers, and CGA amb 25' with RW. Steady gait. No fatigue noted. Pt declining hallway amb due to 'looking a mess.' Pt in recliner at end of session. Husband present in room. Pt's niece will be staying with her upon d/c, for 2 weeks. Updated d/c rec to OPPT.      If plan is discharge home, recommend the following: Assist for transportation;A little help with walking and/or transfers;A little help with bathing/dressing/bathroom;Assistance with cooking/housework   Can travel by private vehicle     Yes  Equipment Recommendations  Rolling walker (2 wheels)    Recommendations for Other Services       Precautions / Restrictions Precautions Precautions: Fall     Mobility  Bed Mobility Overal bed mobility: Needs Assistance Bed Mobility: Supine to Sit     Supine to sit: Min assist, HOB elevated, Used rails     General bed mobility comments: increased time, cues for sequencing    Transfers Overall transfer level: Needs assistance Equipment used: Rolling walker (2 wheels) Transfers: Sit to/from Stand, Bed to chair/wheelchair/BSC Sit to Stand: Min assist   Step pivot transfers: Min assist       General transfer comment: assist to power up, cues for sequencing    Ambulation/Gait Ambulation/Gait assistance: Contact guard assist Gait Distance (Feet): 25 Feet Assistive device: Rolling walker  (2 wheels) Gait Pattern/deviations: Step-through pattern, Decreased stride length   Gait velocity interpretation: <1.31 ft/sec, indicative of household ambulator   General Gait Details: Cues for proximity to RW. Ambulated in room only due to pt preference. She feels she looks a mess from being in the bed.   Stairs             Wheelchair Mobility     Tilt Bed    Modified Rankin (Stroke Patients Only)       Balance Overall balance assessment: History of Falls, Needs assistance Sitting-balance support: No upper extremity supported, Feet supported Sitting balance-Leahy Scale: Good     Standing balance support: During functional activity, Bilateral upper extremity supported, Reliant on assistive device for balance Standing balance-Leahy Scale: Poor                              Cognition Arousal: Alert Behavior During Therapy: WFL for tasks assessed/performed Overall Cognitive Status: Within Functional Limits for tasks assessed                                 General Comments: Pt appears back to baseline for cognition.        Exercises      General Comments General comments (skin integrity, edema, etc.): SpO2 100% on 2L on arrival. Mobilized on RA, SpO2 97%. Remained on RA at end of session.      Pertinent Vitals/Pain Pain Assessment Pain Assessment: Faces Faces Pain Scale: Hurts a little bit Pain Location: headache Pain  Descriptors / Indicators: Headache Pain Intervention(s): Repositioned, Monitored during session    Home Living                          Prior Function            PT Goals (current goals can now be found in the care plan section) Acute Rehab PT Goals Patient Stated Goal: home Progress towards PT goals: Progressing toward goals    Frequency    Min 1X/week      PT Plan      Co-evaluation              AM-PAC PT "6 Clicks" Mobility   Outcome Measure  Help needed turning from your  back to your side while in a flat bed without using bedrails?: None Help needed moving from lying on your back to sitting on the side of a flat bed without using bedrails?: A Little Help needed moving to and from a bed to a chair (including a wheelchair)?: A Little Help needed standing up from a chair using your arms (e.g., wheelchair or bedside chair)?: A Little Help needed to walk in hospital room?: A Little Help needed climbing 3-5 steps with a railing? : A Lot 6 Click Score: 18    End of Session Equipment Utilized During Treatment: Gait belt Activity Tolerance: Patient tolerated treatment well Patient left: in chair;with call bell/phone within reach;with chair alarm set;with family/visitor present Nurse Communication: Mobility status PT Visit Diagnosis: Unsteadiness on feet (R26.81);Other abnormalities of gait and mobility (R26.89);History of falling (Z91.81)     Time: 1191-4782 PT Time Calculation (min) (ACUTE ONLY): 25 min  Charges:    $Gait Training: 8-22 mins $Therapeutic Activity: 8-22 mins PT General Charges $$ ACUTE PT VISIT: 1 Visit                     Ferd Glassing., PT  Office # [phone]    Ilda Foil 06/29/2023, 1:03 PM

## 2023-06-29 NOTE — Evaluation (Signed)
Occupational Therapy Evaluation Patient Details Name: Alison Collins MRN: [id number] DOB: [birth year] Today's Date: 06/29/2023   History of Present Illness 53 y.o. female who presents 06/19/23 for evaluation of confusion, falls, and hydrocephalus. 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  Underwent ETV with neurosurgery 1/21. PMH significant for hypertension, seizures, and recent diagnosis of diabetes mellitus   Clinical Impression   Pt admitted with the above diagnosis. Pt currently with functional limitations due to the deficits listed below (see OT Problem List). Prior to admit, pt was living at home with husband independent with all ADL tasks and functional mobility.  Pt will benefit from acute skilled OT to increase their safety and independence with ADL and functional mobility for ADL to facilitate discharge. Recommend follow up outpatient OT at discharged to focus on cognitive deficits and help pt return to highest level of independence when completing daily tasks.         If plan is discharge home, recommend the following: Supervision due to cognitive status;Assistance with cooking/housework;Assist for transportation    Functional Status Assessment  Patient has had a recent decline in their functional status and demonstrates the ability to make significant improvements in function in a reasonable and predictable amount of time.  Equipment Recommendations  None recommended by OT       Precautions / Restrictions Precautions Precautions: Fall Restrictions Weight Bearing Restrictions Per Provider Order: No      Mobility Bed Mobility Overal bed mobility: Needs Assistance Bed Mobility: Sit to Supine       Sit to supine: Supervision   General bed mobility comments: increased time to process and VC provided for sequencing    Transfers Overall transfer level: Needs assistance Equipment used: Rolling walker (2 wheels) Transfers: Sit to/from Stand, Bed to  chair/wheelchair/BSC Sit to Stand: Supervision     Step pivot transfers: Min assist     General transfer comment: Physically pt did not require assistance. Max difficulty with processing request and following directions during functional transfer. Slightly impulsive during sit<>stand transitions. VC were provided for hand placement with RW management prior to sit to stand and during functional mobility. Pt demonstrated increased difficulty with safey RW management.      Balance Overall balance assessment: History of Falls, Needs assistance Sitting-balance support: No upper extremity supported, Feet supported Sitting balance-Leahy Scale: Good     Standing balance support: During functional activity, Bilateral upper extremity supported Standing balance-Leahy Scale: Poor Standing balance comment: requires light assist throughout for maintaining balance        ADL either performed or assessed with clinical judgement   ADL     General ADL Comments: Pt is able to complete BADL tasks with set-up/supervision assist.     Vision Baseline Vision/History: 0 No visual deficits Ability to See in Adequate Light: 0 Adequate Patient Visual Report: No change from baseline Vision Assessment?: No apparent visual deficits     Perception Perception: Within Functional Limits       Praxis Praxis: WFL       Pertinent Vitals/Pain Pain Assessment Pain Assessment: Faces Faces Pain Scale: No hurt     Extremity/Trunk Assessment Upper Extremity Assessment Upper Extremity Assessment: Overall WFL for tasks assessed   Lower Extremity Assessment Lower Extremity Assessment: Defer to PT evaluation   Cervical / Trunk Assessment Cervical / Trunk Assessment: Normal   Communication Communication Communication: Difficulty communicating thoughts/reduced clarity of speech Cueing Techniques: Verbal cues;Tactile cues   Cognition Arousal: Alert Behavior During Therapy: WFL for tasks  assessed/performed Overall Cognitive  Status: Impaired/Different from baseline Area of Impairment: Following commands, Safety/judgement, Problem solving    Orientation Level: Person, Place, Time, Situation Current Attention Level: Focused Memory: Decreased short-term memory Following Commands: Follows one step commands inconsistently Safety/Judgement: Decreased awareness of deficits   Problem Solving: Slow processing, Difficulty sequencing, Requires verbal cues, Requires tactile cues General Comments: intermittent difficulty with word finding during session     General Comments  Pt SpO2 100% on RA. VSS            Home Living Family/patient expects to be discharged to:: Private residence Living Arrangements: Spouse/significant other Available Help at Discharge: Family;Available PRN/intermittently;Friend(s) (Niece from [place] is on her way here to stay with pt for at least 2 weeks when discharged) Type of Home: Apartment Home Access: Level entry     Home Layout: One level     Bathroom Shower/Tub: Chief Strategy Officer: Standard     Home Equipment: None          Prior Functioning/Environment Prior Level of Function : Independent/Modified Independent    Mobility Comments: recent fall due to imbalance ADLs Comments: Independent  ADLs        OT Problem List: Decreased cognition;Decreased safety awareness;Decreased knowledge of use of DME or AE;Impaired balance (sitting and/or standing)      OT Treatment/Interventions: Self-care/ADL training;Therapeutic exercise;Therapeutic activities;Cognitive remediation/compensation;Patient/family education;Balance training    OT Goals(Current goals can be found in the care plan section) Acute Rehab OT Goals Patient Stated Goal: to lay down in bed OT Goal Formulation: With patient Time For Goal Achievement: 07/13/23 Potential to Achieve Goals: Good  OT Frequency: Min 1X/week       AM-PAC OT "6 Clicks" Daily  Activity     Outcome Measure Help from another person eating meals?: None Help from another person taking care of personal grooming?: None Help from another person toileting, which includes using toliet, bedpan, or urinal?: A Little Help from another person bathing (including washing, rinsing, drying)?: A Little Help from another person to put on and taking off regular upper body clothing?: A Little Help from another person to put on and taking off regular lower body clothing?: A Little 6 Click Score: 20   End of Session Equipment Utilized During Treatment: Rolling walker (2 wheels)  Activity Tolerance: Patient tolerated treatment well Patient left: in bed;with call bell/phone within reach;with bed alarm set;with family/visitor present  OT Visit Diagnosis: Other abnormalities of gait and mobility (R26.89);Other symptoms and signs involving cognitive function                Time: 1135-1158 OT Time Calculation (min): 23 min Charges:  OT General Charges $OT Visit: 1 Visit OT Evaluation $OT Eval Moderate Complexity: 1 Mod  [employer], OTR/L,CBIS  Supplemental OT - [facility] and [facility] Secure Chat Preferred    Chandra Asher, Charisse March 06/29/2023, 1:37 PM

## 2023-06-29 NOTE — Anesthesia Postprocedure Evaluation (Signed)
Anesthesia Post Note  Patient: Kobee Sikkema  Procedure(s) Performed: Endoscopic Third Ventriculostomy (Right: Head) APPLICATION OF CRANIAL NAVIGATION (Right)     Patient location during evaluation: PACU Anesthesia Type: General Level of consciousness: awake and alert Pain management: pain level controlled Vital Signs Assessment: post-procedure vital signs reviewed and stable Respiratory status: spontaneous breathing, nonlabored ventilation, respiratory function stable and patient connected to nasal cannula oxygen Cardiovascular status: blood pressure returned to baseline and stable Postop Assessment: no apparent nausea or vomiting Anesthetic complications: no   No notable events documented.          Mariann Barter

## 2023-06-29 NOTE — TOC Transition Note (Addendum)
Transition of Care [facility]) - Discharge Note   Patient Details  Name: Delana Molock MRN: [id number] Date of Birth: 05/27/1971  Transition of Care [facility]) CM/SW Contact:  Glennon Mac, RN Phone Number: 06/29/2023, 2:00 PM   Clinical Narrative:    53 y.o. female who presents 06/19/23 for evaluation of confusion, falls, and hydrocephalus. 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 Underwent ETV with neurosurgery 1/21.  Pt medically stable for discharge; husband and niece to provide needed assistance at discharge.  PT/OT  recommending OP therapies.  Referral to [facility] for follow up.      Final next level of care: OP Rehab Barriers to Discharge: Barriers Resolved                         Discharge Plan and Services Additional resources added to the After Visit Summary for  transportation needs Transportation Guide added to AVS   Discharge Planning Services: CM Consult                                 Social Drivers of Health (SDOH) Interventions SDOH Screenings   Food Insecurity: No Food Insecurity (06/19/2023)  Housing: Low Risk  (06/19/2023)  Transportation Needs: Unmet Transportation Needs (06/19/2023)  Utilities: Not At Risk (06/19/2023)  Tobacco Use: Low Risk  (06/28/2023)     Readmission Risk Interventions     No data to display         Quintella Baton, RN, BSN  Trauma/Neuro ICU Case Manager [phone]

## 2023-06-29 NOTE — Discharge Summary (Signed)
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  Diet recommendation:   Regular.  Wound care: None.  Code status: Full.   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  Spouse concerned that she may be taking extra doses of her medications due to forgetting that she had already taken them.  Admitted for obstructive hydrocephalus.  Neurosurgery consulted, they obtained a repeat MRI brain with and without contrast, she was found to have obstructive hydrocephalus  based on the MRI again. S/p surgery    Hospital Course by Problem:   Obstructive hydrocephalus: Neurosurgery consulted, they obtained a repeat MRI brain with and without contrast, she was found to have obstructive hydrocephalus based on the MRI again.  -much improved post surgery -PT eval -- outpatient PT -home today??   Seizure disorder/supratherapeutic valproate level Valproate level 133 in ED, spouse believes that patient was taking extra doses of her medication due to forgetting that she had already taken it Depakote was briefly held, levels were closely monitored.  Valproate level down to 45 on 1/14.  But repeat on 117 is within normal range. As discussed with neurology, continue prior home maintenance dose.  Resumed valproate.  Patient has been given clear instructions that patient should not take more than the advised dose of valproate and spouse can supervise medications. Pharmacy reviewed home med list and no drug to drug interactions that would cause increase in valproate levels.   Essential hypertension Much better controlled now.  Continue HCTZ 25 Mg daily.  Added lisinopril 20 mg p.o. daily which will help in prevention of diabetic nephropathy as well.  Renal function is normal    Newly diagnosed type II DM A1c 6.8.   Overweight Estimated body mass index is 35.42 kg/m as calculated from the following:   Height as of this encounter: 5\' 3"  (1.6 m).   Weight as of 03/04/23: 90.7 kg.       Medical Consultants:   NS neurology   Discharge Exam:   Vitals:   06/29/23 1200 06/29/23 1300  BP: 112/65 129/73  Pulse:    Resp: 16 (!) 21  Temp:    SpO2: 96%    Vitals:   06/29/23 1105 06/29/23 1152 06/29/23 1200 06/29/23 1300  BP: Marland Kitchen)  151/121 129/82 112/65 129/73  Pulse:      Resp:  18 16 (!) 21  Temp:      TempSrc:      SpO2:   96%   Height:        General exam: Appears calm and comfortable.    The results of significant diagnostics from this hospitalization  (including imaging, microbiology, ancillary and laboratory) are listed below for reference.     Procedures and Diagnostic Studies:   No results found.   Labs:   Basic Metabolic Panel: Recent Labs  Lab 06/29/23 0505  NA 133*  K 3.7  CL 99  CO2 24  GLUCOSE 139*  BUN 22*  CREATININE 0.98  CALCIUM 8.7*  MG 1.9  PHOS 3.6   GFR CrCl cannot be calculated (Unknown ideal weight.). Liver Function Tests: No results for input(s): "AST", "ALT", "ALKPHOS", "BILITOT", "PROT", "ALBUMIN" in the last 168 hours. No results for input(s): "LIPASE", "AMYLASE" in the last 168 hours. No results for input(s): "AMMONIA" in the last 168 hours. Coagulation profile No results for input(s): "INR", "PROTIME" in the last 168 hours.  CBC: Recent Labs  Lab 06/29/23 0505  WBC 11.4*  HGB 12.7  HCT 37.5  MCV 84.3  PLT 223   Cardiac Enzymes: No results for input(s): "CKTOTAL", "CKMB", "CKMBINDEX", "TROPONINI" in the last 168 hours. BNP: Invalid input(s): "POCBNP" CBG: Recent Labs  Lab 06/28/23 1316 06/28/23 1546 06/28/23 2109 06/29/23 0741 06/29/23 1217  GLUCAP 94 105* 210* 126* 110*   D-Dimer No results for input(s): "DDIMER" in the last 72 hours. Hgb A1c No results for input(s): "HGBA1C" in the last 72 hours. Lipid Profile No results for input(s): "CHOL", "HDL", "LDLCALC", "TRIG", "CHOLHDL", "LDLDIRECT" in the last 72 hours. Thyroid function studies No results for input(s): "TSH", "T4TOTAL", "T3FREE", "THYROIDAB" in the last 72 hours.  Invalid input(s): "FREET3" Anemia work up No results for input(s): "VITAMINB12", "FOLATE", "FERRITIN", "TIBC", "IRON", "RETICCTPCT" in the last 72 hours. Microbiology Recent Results (from the past 240 hours)  MRSA Next Gen by PCR, Nasal     Status: None   Collection Time: 06/28/23 12:11 PM   Specimen: Nasal Mucosa; Nasal Swab  Result Value Ref Range Status   MRSA by PCR Next Gen NOT DETECTED NOT DETECTED Final    Comment: (NOTE) The GeneXpert  MRSA Assay (FDA approved for NASAL specimens only), is one component of a comprehensive MRSA colonization surveillance program. It is not intended to diagnose MRSA infection nor to guide or monitor treatment for MRSA infections. Test performance is not FDA approved in patients less than 50 years old. Performed at [facility], [address], [place], [place] [address]      Discharge Instructions:   Discharge Instructions     Diet Carb Modified   Complete by: As directed    Increase activity slowly   Complete by: As directed       Allergies as of 06/29/2023       Reactions   Gluten Meal    Penicillins Swelling   Pt states medication gives her bruises, no SOB or hives/itching. Childhood reaction  Has patient had a PCN reaction causing immediate rash, facial/tongue/throat swelling, SOB or lightheadedness with hypotension: Yes Has patient had a PCN reaction causing severe rash involving mucus membranes or skin necrosis: No Has patient had a PCN reaction that required hospitalization:No Has patient had a PCN reaction occurring within the last 10 years: /No If all of the above answers are "NO",  then may proceed        Medication List     TAKE these medications    divalproex 500 MG DR tablet Commonly known as: Depakote Take 1 tablet (500 mg total) by mouth 2 (two) times daily.   hydrochlorothiazide 25 MG tablet Commonly known as: HYDRODIURIL Take 1 tablet (25 mg total) by mouth daily.   lisinopril 20 MG tablet Commonly known as: ZESTRIL Take 1 tablet (20 mg total) by mouth daily.   Magnesium 300 MG Caps Take 1 capsule by mouth daily at 6 (six) AM.   metFORMIN 500 MG tablet Commonly known as: GLUCOPHAGE Take 500 mg by mouth 2 (two) times daily with a meal.   Vitamin B 12 500 MCG Tabs Take 1 tablet by mouth daily.        Follow-up Information     [facility]. Call.   Why: PLEASE call [facility] they will work with yoou call  to obtain and primary care doctor. THey have a pharmacy and have finacial counselling Contact information: [address] [employer] [address] [place] [address] [phone]        [place] Medicaid Follow up.   Why: Call to apply for medicaid or go down to Social services office Contact information: [phone]        PCP Follow up in 1 week(s).                   Time coordinating discharge: 45 min  Signed:  Joseph Art DO  [facility] 06/29/2023, 1:53 PM

## 2023-06-29 NOTE — Progress Notes (Signed)
    Providing Compassionate, Quality Care - Together   NEUROSURGERY PROGRESS NOTE     S: No issues overnight.    O: EXAM:  BP 108/74   Pulse 79   Temp 97.7 F (36.5 C) (Oral)   Resp 17   Ht 5\' 3"  (1.6 m)   LMP 05/16/2017 (Approximate)   SpO2 94%   BMI 35.42 kg/m     Awake, alert, oriented  Speech fluent, appropriate  CNs grossly intact  BUE/BLE strength grossly intact SILTx4 Dressing c/d/i   ASSESSMENT:  53 y.o. with  -Hydrocephalus s/p ETV, POD#1    PLAN: -Therapies as tolerated today -CTH showing expected p op findings -Consider dc to home today pending progress w/ therapy.  -Call w/ questions/concerns.   Alison Collins, [facility]

## 2023-06-29 NOTE — Progress Notes (Signed)
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  Spouse concerned that she may be taking extra doses of her medications due to forgetting that she had already taken them.  Admitted for obstructive hydrocephalus.  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  -much improved post surgery -PT eval -- outpatient PT -home today??  Seizure disorder/supratherapeutic valproate  level Valproate level 133 in ED, spouse believes that patient was taking extra doses of her medication due to forgetting that she had already taken it Depakote was briefly held, levels were closely monitored.  Valproate level down to 45 on 1/14.  But repeat on 117 is within normal range. As discussed with neurology, continue prior home maintenance dose.  Resumed valproate.  Patient has been given clear instructions that patient should not take more than the advised dose of valproate and spouse can supervise medications. Pharmacy reviewed home med list and no drug to drug interactions that would cause increase in valproate levels.   Essential hypertension Much better controlled now.  Continue HCTZ 25 Mg daily.  Added lisinopril 20 mg p.o. daily which will help in prevention of diabetic nephropathy as well.  Renal function is normal at the moment.   Newly diagnosed type II DM A1c 6.8. Good inpatient control on NovoLog SSI.   Overweight Estimated body mass index is 35.42 kg/m as calculated from the following:   Height as of this encounter: 5\' 3"  (1.6 m).   Weight as of 03/04/23: 90.7 kg.   DVT prophylaxis: SCDs Start: 06/28/23 1806   Code Status: Full Code  Family Communication: Husband present at bedside.  Plan of care discussed with patient in length and he/she verbalized understanding and agreed with it.  Status is: Inpatient Home today?     Consultants:  Neurosurgery       Subjective: Confused to year but otherwise at baseline  Objective: Vitals:   06/29/23 1105 06/29/23 1152 06/29/23 1200 06/29/23 1300  BP: (!) 151/121 129/82 112/65 129/73  Pulse:      Resp:  18 16 (!) 21  Temp:      TempSrc:      SpO2:  Height:        Intake/Output Summary (Last 24 hours) at 06/29/2023 1326 Last data filed at 06/29/2023 0600 Gross per 24 hour  Intake 600 ml  Output 510 ml  Net 90 ml    There were no vitals filed for this visit.  Examination:  In chair, NAD Alert to  person and place  Data Reviewed: I have personally reviewed following labs and imaging studies  CBC: Recent Labs  Lab 06/29/23 0505  WBC 11.4*  HGB 12.7  HCT 37.5  MCV 84.3  PLT 223    Basic Metabolic Panel: Recent Labs  Lab 06/29/23 0505  NA 133*  K 3.7  CL 99  CO2 24  GLUCOSE 139*  BUN 22*  CREATININE 0.98  CALCIUM 8.7*  MG 1.9  PHOS 3.6    GFR: CrCl cannot be calculated (Unknown ideal weight.). Liver Function Tests: No results for input(s): "AST", "ALT", "ALKPHOS", "BILITOT", "PROT", "ALBUMIN" in the last 168 hours.  No results for input(s): "LIPASE", "AMYLASE" in the last 168 hours. No results for input(s): "AMMONIA" in the last 168 hours.  Coagulation Profile: No results for input(s): "INR", "PROTIME" in the last 168 hours. Cardiac Enzymes: No results for input(s): "CKTOTAL", "CKMB", "CKMBINDEX", "TROPONINI" in the last 168 hours. BNP (last 3 results) No results for input(s): "PROBNP" in the last 8760 hours. HbA1C: No results for input(s): "HGBA1C" in the last 72 hours. CBG: Recent Labs  Lab 06/28/23 1316 06/28/23 1546 06/28/23 2109 06/29/23 0741 06/29/23 1217  GLUCAP 94 105* 210* 126* 110*   Lipid Profile: No results for input(s): "CHOL", "HDL", "LDLCALC", "TRIG", "CHOLHDL", "LDLDIRECT" in the last 72 hours. Thyroid Function Tests: No results for input(s): "TSH", "T4TOTAL", "FREET4", "T3FREE", "THYROIDAB" in the last 72 hours. Anemia Panel: No results for input(s): "VITAMINB12", "FOLATE", "FERRITIN", "TIBC", "IRON", "RETICCTPCT" in the last 72 hours. Sepsis Labs: No results for input(s): "PROCALCITON", "LATICACIDVEN" in the last 168 hours.  Recent Results (from the past 240 hours)  MRSA Next Gen by PCR, Nasal     Status: None   Collection Time: 06/28/23 12:11 PM   Specimen: Nasal Mucosa; Nasal Swab  Result Value Ref Range Status   MRSA by PCR Next Gen NOT DETECTED NOT DETECTED Final    Comment: (NOTE) The GeneXpert MRSA Assay (FDA  approved for NASAL specimens only), is one component of a comprehensive MRSA colonization surveillance program. It is not intended to diagnose MRSA infection nor to guide or monitor treatment for MRSA infections. Test performance is not FDA approved in patients less than 41 years old. Performed at [facility], [address], [place], [place] [address]      Radiology Studies: CT HEAD WO CONTRAST ( ) Result Date: 06/29/2023 CLINICAL DATA:  53 year old female with frequent falls. Hydrocephalus. Postoperative day 1 status post endoscopic 3rd ventriculostomy. EXAM: CT HEAD WITHOUT CONTRAST TECHNIQUE: Contiguous axial images were obtained from the base of the skull through the vertex without intravenous contrast. RADIATION DOSE REDUCTION: This exam was performed according to the departmental dose-optimization program which includes automated exposure control, adjustment of the mA and/or kV according to patient size and/or use of iterative reconstruction technique. COMPARISON:  Preoperative CT 06/22/2023 and earlier. FINDINGS: Brain: Small volume bilateral postoperative pneumo-ventricle, and trace layering intraventricular hemorrhage (series 2, image 13). Subtle postoperative tract through the right superior frontal lobe to the right lateral ventricle (series 2, image 23). No other intracranial hemorrhage identified. Stable size and configuration of the lateral and 3rd ventricles. Stable more  diminutive cerebral aqueduct and 4th ventricle. Unchanged basilar cisterns. Chronic partially empty sella. Stable gray-white matter differentiation throughout the brain. No cortically based acute infarct identified. Vascular: No suspicious intracranial vascular hyperdensity. Skull: Right vertex burr hole.  Otherwise stable and intact. Sinuses/Orbits: Visualized paranasal sinuses and mastoids are stable and well aerated. Other: Right superior convexity scalp postoperative changes with skin staples, broad-based  mild scalp hematoma. Orbits soft tissues appears stable and negative. IMPRESSION: 1. Mild postoperative pneumo-ventricle and IVH status post 3rd ventriculostomy. Stable ventricle size and configuration otherwise. 2. Postoperative changes to the skull and scalp at the right vertex. Electronically Signed   By: Odessa Fleming M.D.   On: 06/29/2023 09:38     Scheduled Meds:  Chlorhexidine Gluconate Cloth  6 each Topical Daily   cyanocobalamin  1,000 mcg Oral Daily   divalproex  500 mg Oral BID   docusate sodium  100 mg Oral BID   hydrochlorothiazide  25 mg Oral Daily   insulin aspart  0-5 Units Subcutaneous QHS   insulin aspart  0-9 Units Subcutaneous TID WC   lisinopril  20 mg Oral Daily   sodium chloride flush  3 mL Intravenous Q12H   Continuous Infusions:  0.9 % NaCl with KCl 20 mEq / L       LOS: 6 days   Joseph Art, DO [facility]  06/29/2023, 1:26 PM   *Please note that this is a verbal dictation therefore any spelling or grammatical errors are due to the "Dragon Medical One" system interpretation.  Please page via Amion and do not message via secure chat for urgent patient care matters. Secure chat can be used for non urgent patient care matters.  How to contact the [facility] Attending or Consulting provider 7A - 7P or covering provider during after hours 7P -7A, for this patient?  Check the care team in [facility] and look for a) attending/consulting TR[facility] provider listed and b) the [facility] team listed. Page or secure chat 7A-7P. Log into [email or web address] and use [facility]'s universal password to access. If you do not have the password, please contact the hospital operator. Locate the [facility] provider you are looking for under [facility] and page to a number that you can be directly reached. If you still have difficulty reaching the provider, please page the [facility] (Director on Call) for the Hospitalists listed on amion for assistance.

## 2023-07-01 ENCOUNTER — Encounter (HOSPITAL_COMMUNITY): Payer: Self-pay | Admitting: Neurosurgery

## 2023-07-04 ENCOUNTER — Other Ambulatory Visit: Payer: Self-pay | Admitting: Family Medicine

## 2023-07-04 NOTE — Telephone Encounter (Signed)
Copied from CRM [id number]. Topic: Clinical - Medication Refill >> Jul 04, 2023 12:00 PM Florestine Avers wrote: Most Recent Primary Care Visit:   Medication: metFORMIN (GLUCOPHAGE) 500 MG tablet  Has the patient contacted their pharmacy? Yes (Agent: If no, request that the patient contact the pharmacy for the refill. If patient does not wish to contact the pharmacy document the reason why and proceed with request.) (Agent: If yes, when and what did the pharmacy advise?)  Is this the correct pharmacy for this prescription? Yes If no, delete pharmacy and type the correct one.  This is the patient's preferred pharmacy:  [id number] [place], [place] - [address] [place] [address] Phone: [phone] Fax: [phone]  [facility] [place], [place] - [address] [place] [address] Phone: [phone] Fax: [phone]   Has the prescription been filled recently? Yes  Is the patient out of the medication? Yes  Has the patient been seen for an appointment in the last year OR does the patient have an upcoming appointment? Yes  Can we respond through MyChart? No  Agent: Please be advised that Rx refills may take up to 3 business days. We ask that you follow-up with your pharmacy.

## 2023-07-09 ENCOUNTER — Ambulatory Visit (HOSPITAL_COMMUNITY)
Admission: EM | Admit: 2023-07-09 | Discharge: 2023-07-09 | Disposition: A | Discharge status: Home / Self Care | Payer: Commercial Managed Care - HMO | Attending: Neurology | Admitting: Neurology

## 2023-07-09 ENCOUNTER — Encounter (HOSPITAL_COMMUNITY): Payer: Self-pay

## 2023-07-09 DIAGNOSIS — N390 Urinary tract infection, site not specified: Secondary | ICD-10-CM | POA: Diagnosis present

## 2023-07-09 LAB — POCT URINALYSIS DIP (MANUAL ENTRY)
Glucose, UA: NEGATIVE mg/dL
Nitrite, UA: POSITIVE — AB
Protein Ur, POC: >=300 mg/dL — AB
Spec Grav, UA: >=1.03 — AB (ref 1.010–1.025)
Urobilinogen, UA: 1 U/dL
pH, UA: 6 (ref 5.0–8.0)

## 2023-07-09 LAB — POCT FASTING CBG KUC MANUAL ENTRY: POCT Glucose (KUC): 138 mg/dL — AB (ref 70–99)

## 2023-07-09 MED ORDER — METFORMIN HCL 500 MG PO TABS: 500.0000 mg | ORAL_TABLET | Freq: Two times a day (BID) | ORAL | 0 refills | Status: DC

## 2023-07-09 MED ORDER — SULFAMETHOXAZOLE-TRIMETHOPRIM 800-160 MG PO TABS: 1.0000 | ORAL_TABLET | Freq: Two times a day (BID) | ORAL | 0 refills | Status: AC

## 2023-07-09 MED ORDER — DIVALPROEX SODIUM 500 MG PO DR TAB: 500.0000 mg | DELAYED_RELEASE_TABLET | Freq: Two times a day (BID) | ORAL | 0 refills | Status: DC

## 2023-07-09 NOTE — ED Provider Notes (Signed)
[facility]    CSN: [id number] Arrival date & time: 07/09/23  1250      History   Chief Complaint Chief Complaint  Patient presents with   Medication Refill   Dysuria    HPI Alison Collins is a 53 y.o. female.   Presenting needing a refill for her metformin and her Depakote.  She has been having some fuzzy headed feeling today and she is attributing this to her not taking her medications today.  She did take her medications yesterday.  Of note she was recently hospitalized and had an Endoscopic Third Ventriculostomy procedure done by [facility].  I did contact [facility] and they will follow-up with her on Monday to schedule an appointment this week to have her staples removed.  She additionally does have a primary care provider appointment already scheduled in February.   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) 07/12/2018   SUI (stress urinary incontinence, female) 01/25/2011   Hypertension 01/25/2011   Depression 01/25/2011   Anxiety 01/25/2011    Past Surgical History:  Procedure Laterality Date   APPLICATION OF CRANIAL NAVIGATION Right 06/28/2023   Procedure: APPLICATION OF CRANIAL NAVIGATION;  Surgeon: Bedelia Person, MD;  [facility];  Service: Neurosurgery;  Laterality: Right;   BREAST SURGERY     LIPOSUCTION      OB History     Gravida  3   Para  1   Term      Preterm      AB  2   Living  1      SAB  2   IAB      Ectopic       Multiple      Live Births               Home Medications    Prior to Admission medications   Medication Sig Start Date End Date Taking? Authorizing Provider  lisinopril (ZESTRIL) 20 MG tablet Take 1 tablet (20 mg total) by mouth daily. 06/22/23 06/21/24 Yes Pahwani, Daleen Bo, MD  sulfamethoxazole-trimethoprim (BACTRIM DS) 800-160 MG tablet Take 1 tablet by mouth 2 (two) times daily for 7 days. 07/09/23 07/16/23 Yes Benuel Ly, Ludger Nutting, NP  Cyanocobalamin (VITAMIN B 12) 500 MCG TABS Take 1 tablet by mouth daily.    [provider]  divalproex (DEPAKOTE) 500 MG DR tablet Take 1 tablet (500 mg total) by mouth 2 (two) times daily. 07/09/23   Elmer Picker, NP  hydrochlorothiazide (HYDRODIURIL) 25 MG tablet Take 1 tablet (25 mg total) by mouth daily. 05/14/23   Jacalyn Lefevre, MD  Magnesium 300 MG CAPS Take 1 capsule by mouth daily at 6 (six) AM.    [provider]  metFORMIN (GLUCOPHAGE) 500 MG tablet Take 1 tablet (500 mg total) by mouth 2 (two) times daily with a meal. 07/09/23 08/11/23  Elmer Picker, NP    Family History Family History  Problem Relation Age of Onset   Hypertension  Mother    Cancer Mother    Heart disease Father    Seizures Sister        epilepsy    Social History Social History   Tobacco Use   Smoking status: Never   Smokeless tobacco: Never  Vaping Use   Vaping status: Never Used  Substance Use Topics   Alcohol use: Yes    Comment: sometimes   Drug use: No     Allergies   Gluten meal and Penicillins   Review of Systems Review of Systems   Physical Exam Triage Vital Signs ED Triage Vitals  Encounter Vitals Group     BP 07/09/23 1351 125/70     Systolic BP Percentile --      Diastolic BP Percentile --      Pulse Rate 07/09/23 1351 75     Resp 07/09/23 1351 18     Temp 07/09/23 1351 97.8 F (36.6 C)     Temp Source 07/09/23 1351 Oral     SpO2 07/09/23 1351 96 %     Weight 07/09/23 1351 199 lb 15.3 oz (90.7 kg)     Height 07/09/23 1351  5\' 3"  (1.6 m)     Head Circumference --      Peak Flow --      Pain Score 07/09/23 1349 0     Pain Loc --      Pain Education --      Exclude from Growth Chart --    No data found.  Updated Vital Signs BP 125/70 (BP Location: Left Arm)   Pulse 75   Temp 97.8 F (36.6 C) (Oral)   Resp 18   Ht 5\' 3"  (1.6 m)   Wt 199 lb 15.3 oz (90.7 kg)   LMP 05/16/2017 (Approximate)   SpO2 96%   BMI 35.42 kg/m   Visual Acuity Right Eye Distance:   Left Eye Distance:   Bilateral Distance:    Right Eye Near:   Left Eye Near:    Bilateral Near:     Physical Exam Vitals and nursing note reviewed.  Constitutional:      General: She is not in acute distress.    Appearance: She is well-developed.  HENT:     Head: Normocephalic and atraumatic.  Eyes:     Conjunctiva/sclera: Conjunctivae normal.  Cardiovascular:     Rate and Rhythm: Normal rate and regular rhythm.     Heart sounds: No murmur heard. Pulmonary:     Effort: Pulmonary effort is normal. No respiratory distress.     Breath sounds: Normal breath sounds.  Abdominal:     Palpations: Abdomen is soft.     Tenderness: There is no abdominal tenderness.  Musculoskeletal:        General: No swelling.     Cervical back: Neck supple.  Skin:    General: Skin is warm and dry.     Capillary Refill: Capillary refill takes less than 2 seconds.     Comments: Staples noted in surgical incision on head  Neurological:     General: No focal deficit present.     Mental Status: She is alert.     Sensory: No sensory deficit.     Motor: No weakness.     Coordination: Coordination normal.     Gait: Gait normal.  Psychiatric:        Mood and Affect: Mood normal.      UC Treatments / Results  Labs (all labs ordered are listed, but  only abnormal results are displayed) Labs Reviewed  POCT FASTING CBG [facility] MANUAL ENTRY - Abnormal; Notable for the following components:      Result Value   POCT Glucose ([facility]) 138 (*)    All other components  within normal limits  POCT URINALYSIS DIP (MANUAL ENTRY) - Abnormal; Notable for the following components:   Color, UA brown (*)    Clarity, UA cloudy (*)    Bilirubin, UA small (*)    Ketones, POC UA moderate (40) (*)    Spec Grav, UA >=1.030 (*)    Blood, UA large (*)    Protein Ur, POC >=300 (*)    Nitrite, UA Positive (*)    Leukocytes, UA Small (1+) (*)    All other components within normal limits  URINE CULTURE    EKG   Radiology No results found.  Procedures Procedures (including critical care time)  Medications Ordered in UC Medications - No data to display  Initial Impression / Assessment and Plan / UC Course  I have reviewed the triage vital signs and the nursing notes.  Pertinent labs & imaging results that were available during my care of the patient were reviewed by me and considered in my medical decision making (see chart for details).  Refills for medications sent.  She will need to follow-up with [facility] and her primary care provider in the next couple of weeks.  She will call them on Monday.  I did contact the neurosurgery PA who discharged her from the hospital and they will additionally have the office follow-up with her on Monday.  She did have a UTI, culture has been sent.  Antibiotics ordered.  She will need to follow-up with her primary care provider, if she continues having frequent UTIs she may need a different diabetes medication.  This was explained in detail and her and her significant other are in agreement  Final Clinical Impressions(s) / UC Diagnoses   Final diagnoses:  None     Discharge Instructions      Please call [facility] on Monday  Follow up with PCP for continued management of diabetes.  Your urine shows you likely have a urinary tract infection.  I have sent your urine for culture to confirm this.  We will call you if we need to change your antibiotic when we find out the type of  bacteria growing in your bladder.  Take antibiotic as directed with a snack/food to avoid stomach upset. To avoid GI upset please take this medication with food.   Avoid drinking beverages that irritate the urinary tract like sodas, tea, coffee, or juice. Drink plenty of water to stay well hydrated and prevent severe infection.  If you develop any new or worsening symptoms or if your symptoms do not start to improve, pleases return here or follow-up with your primary care provider. If your symptoms are severe, please go to the emergency room.     ED Prescriptions     Medication Sig Dispense Auth. Provider   divalproex (DEPAKOTE) 500 MG DR tablet Take 1 tablet (500 mg total) by mouth 2 (two) times daily. 60 tablet Pamalee Leyden, [place], NP   metFORMIN (GLUCOPHAGE) 500 MG tablet Take 1 tablet (500 mg total) by mouth 2 (two) times daily with a meal. 66 tablet Pamalee Leyden, [place], NP   sulfamethoxazole-trimethoprim (BACTRIM DS) 800-160 MG tablet Take 1 tablet by mouth 2 (two) times daily for 7 days. 6 tablet Elmer Picker, NP  PDMP not reviewed this encounter.   Elmer Picker, NP 07/09/23 1538

## 2023-07-09 NOTE — Discharge Instructions (Addendum)
Please call [facility] on Monday  Follow up with PCP for continued management of diabetes.  Refills for your Depakote and metformin have been sent to your pharmacy. Take your tonight doses when you get your medications and then resume your normal dosing tomorrow.  Your urine shows you likely have a urinary tract infection.  I have sent your urine for culture to confirm this.  We will call you if we need to change your antibiotic when we find out the type of bacteria growing in your bladder.  Take antibiotic as directed with a snack/food to avoid stomach upset. To avoid GI upset please take this medication with food.   Avoid drinking beverages that irritate the urinary tract like sodas, tea, coffee, or juice. Drink plenty of water to stay well hydrated and prevent severe infection.  If you develop any new or worsening symptoms or if your symptoms do not start to improve, pleases return here or follow-up with your primary care provider. If your symptoms are severe, please go to the emergency room.

## 2023-07-09 NOTE — ED Triage Notes (Addendum)
Patient needing a refill on her Divalproex and Metformin. Patient ran out of medications yesterday. Patient states she is feeling dizzy, fuzzy headed, and confused onset this morning.   Patient states she is also having dysuria and blood in the urine.

## 2023-07-12 LAB — URINE CULTURE: Culture: 80000 — AB

## 2023-08-04 ENCOUNTER — Encounter: Payer: Self-pay | Admitting: Family Medicine

## 2023-08-04 ENCOUNTER — Ambulatory Visit: Payer: Commercial Managed Care - HMO | Admitting: Family Medicine

## 2023-08-04 VITALS — BP 154/84 | HR 62 | Temp 98.0°F | Ht 62.0 in | Wt 222.2 lb

## 2023-08-04 DIAGNOSIS — I10 Essential (primary) hypertension: Secondary | ICD-10-CM | POA: Diagnosis not present

## 2023-08-04 DIAGNOSIS — Z7984 Long term (current) use of oral hypoglycemic drugs: Secondary | ICD-10-CM

## 2023-08-04 DIAGNOSIS — E119 Type 2 diabetes mellitus without complications: Secondary | ICD-10-CM

## 2023-08-04 DIAGNOSIS — F322 Major depressive disorder, single episode, severe without psychotic features: Secondary | ICD-10-CM

## 2023-08-04 DIAGNOSIS — F419 Anxiety disorder, unspecified: Secondary | ICD-10-CM

## 2023-08-04 DIAGNOSIS — Z7689 Persons encountering health services in other specified circumstances: Secondary | ICD-10-CM

## 2023-08-04 NOTE — Patient Instructions (Addendum)
-  It was a pleasure meeting you and look forward to taking care of you. -If you start to feel suicidal  please reach out to the following:  Call 911  Or  [facility] [address] [place], [place], [place] [address]   [phone] Or  [facility] [address] [place] [address] [place],  [place]  [address] Main: [phone] -Placed a referral to Psychiatry and psychology.  -Please call [facility] to make an appointment.  [address] [place], [place] [address]  [phone] -Follow up in 2 weeks.

## 2023-08-04 NOTE — Progress Notes (Unsigned)
 New Patient Office Visit  Subjective   Patient ID: Alison Collins, female    DOB: 1970/12/18  Age: 53 y.o. MRN: [id number]  CC: No chief complaint on file.   HPI Alison Collins presents to establish care with new provider.   Patients previous primary care provider: None   Specialist: [facility]   HTN: Chronic. Patient takes HCTZ 25mg  daily and Lisinopril 20mg  daily. Denies monitoring her blood pressure. Denies chest pain, SHOB, HA, dizziness, lightheadedness, or lower extremity edema.  BP Readings from Last 3 Encounters:  08/04/23 (!) 154/84  07/09/23 125/70  06/29/23 129/73    Diabetes: Chronic. Patient takes Metformin 500mg  BID. Does not monitor her blood sugar at home.  Lab Results  Component Value Date   HGBA1C 6.8 (H) 06/20/2023   Patient reports she feels depressed. Patient is experiencing intermittent suicidal thoughts. Last thought about harming herself was today, but denies currently feeling suicidal. She reports she does not have a job, needs her driving license, does not feel beautiful because her hair is falling out, and thinks she is a burden to her spouse. She denies having a plan to harm herself. She reports when she feels suicidal and very depressed she goes for a walk, listens to music, or reads a book.   Patient was admitted at [facility] on 06/19/2023 through    Outpatient Encounter Medications as of 08/04/2023  Medication Sig   Cyanocobalamin (VITAMIN B 12) 500 MCG TABS Take 1 tablet by mouth daily.   divalproex (DEPAKOTE) 500 MG DR tablet Take 1 tablet (500 mg total) by mouth 2 (two) times daily.   hydrochlorothiazide (HYDRODIURIL) 25 MG tablet Take 1 tablet (25 mg total) by mouth daily.   lisinopril (ZESTRIL) 20 MG tablet Take 1 tablet (20 mg total) by mouth daily.   Magnesium 300 MG CAPS Take 1 capsule by mouth daily at 6 (six) AM.   metFORMIN (GLUCOPHAGE) 500 MG tablet Take 1 tablet (500 mg total) by mouth 2 (two) times daily with a meal.    No facility-administered encounter medications on file as of 08/04/2023.    Past Medical History:  Diagnosis Date   Anemia    GDM (gestational diabetes mellitus)    Hypertension    Migraine    Non-insulin dependent type 2 diabetes mellitus (HCC) 06/19/2023   Normal spontaneous vaginal delivery    Seizures (HCC)    Spontaneous abortion    2    Past Surgical History:  Procedure Laterality Date   APPLICATION OF CRANIAL NAVIGATION Right 06/28/2023   Procedure: APPLICATION OF CRANIAL NAVIGATION;  Surgeon: Bedelia Person, MD;  Location: [facility] OR;  Service: Neurosurgery;  Laterality: Right;   BREAST SURGERY     LIPOSUCTION      Family History  Problem Relation Age of Onset   Hypertension Mother    Cancer Mother    Heart disease Father    Seizures Sister        epilepsy    Social History   Socioeconomic History   Marital status: Significant Other    Spouse name: Not on file   Number of children: 1   Years of education: Not on file   Highest education level: Not on file  Occupational History   Not on file  Tobacco Use   Smoking status: Never   Smokeless tobacco: Never  Vaping Use   Vaping status: Never Used  Substance and Sexual Activity   Alcohol use: Yes    Comment: sometimes  Drug use: No   Sexual activity: Yes    Birth control/protection: None  Other Topics Concern   Not on file  Social History Narrative   Lives at home with [name] her s.o.   Right handed   Caffeine: sometimes   Social Drivers of Corporate investment banker Strain: Not on file  Food Insecurity: No Food Insecurity (06/19/2023)   Hunger Vital Sign    Worried About Running Out of Food in the Last Year: Never true    Ran Out of Food in the Last Year: Never true  Transportation Needs: Unmet Transportation Needs (06/19/2023)   PRAPARE - Administrator, Civil Service (Medical): Yes    Lack of Transportation (Non-Medical): No  Physical Activity: Not on file  Stress: Not on  file  Social Connections: Not on file  Intimate Partner Violence: Not At Risk (06/19/2023)   Humiliation, Afraid, Rape, and Kick questionnaire    Fear of Current or Ex-Partner: No    Emotionally Abused: No    Physically Abused: No    Sexually Abused: No    ROS See HPI above    Objective    LMP 05/16/2017 (Approximate)   Physical Exam    Assessment & Plan:  There are no diagnoses linked to this encounter. 1.Review health maintenance:  -Cervical Cancer Screening: Declines referral  -Colonoscopy/cologuard: Declines both -Covid booster vaccine: Declines  -Hep C:  -Zoster vaccine: Declines  -Mammogram: Declines  No follow-ups on file.   Zandra Abts, NP

## 2023-08-13 ENCOUNTER — Ambulatory Visit (HOSPITAL_COMMUNITY)
Admission: EM | Admit: 2023-08-13 | Discharge: 2023-08-13 | Disposition: A | Discharge status: Home / Self Care | Attending: Emergency Medicine | Admitting: Emergency Medicine

## 2023-08-13 ENCOUNTER — Other Ambulatory Visit: Payer: Self-pay

## 2023-08-13 ENCOUNTER — Encounter (HOSPITAL_COMMUNITY): Payer: Self-pay | Admitting: Emergency Medicine

## 2023-08-13 DIAGNOSIS — H1033 Unspecified acute conjunctivitis, bilateral: Secondary | ICD-10-CM | POA: Diagnosis not present

## 2023-08-13 MED ORDER — MOXIFLOXACIN HCL 0.5 % OP SOLN: 1.0000 [drp] | Freq: Three times a day (TID) | OPHTHALMIC | 0 refills | Status: DC

## 2023-08-13 NOTE — ED Triage Notes (Signed)
 Spanish speaker pt c/o eye swollen and redness for the past 2 weeks with itchiness and burning sensation. Denies any injury to her eye. Attempting to use OTC medication with no relief.

## 2023-08-13 NOTE — ED Provider Notes (Signed)
 [facility]    CSN: [id number] Arrival date & time: 08/13/23  1034      History   Chief Complaint Chief Complaint  Patient presents with   Eye Problem    HPI Alison Collins is a 53 y.o. female.  2-week history of gradually worsening bilateral eye redness Having itching and burning pain.  Watering but no discharge. Not having vision changes.  Denies swelling around the eyes, no eye injury or foreign body sensation. 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   SUI (stress urinary incontinence, female) 01/25/2011   Hypertension 01/25/2011   Depression 01/25/2011   Anxiety 01/25/2011    Past Surgical History:  Procedure Laterality Date   APPLICATION OF CRANIAL NAVIGATION Right 06/28/2023   Procedure: APPLICATION OF CRANIAL NAVIGATION;  Surgeon: Bedelia Person, MD;  Location[facility] OR;  Service: Neurosurgery;  Laterality: Right;   BREAST SURGERY     LIPOSUCTION      OB History     Gravida  3   Para  1   Term      Preterm      AB  2   Living  1      SAB  2   IAB      Ectopic      Multiple      Live Births               Home Medications    Prior to Admission medications   Medication Sig Start Date End Date Taking? Authorizing Provider  moxifloxacin (VIGAMOX) 0.5 % ophthalmic solution Place 1 drop into both eyes 3 (three) times daily. 08/13/23  Yes Jonn Chaikin,  Lurena Joiner, PA-C  Cyanocobalamin (VITAMIN B 12) 500 MCG TABS Take 1 tablet by mouth daily.    [provider]  divalproex (DEPAKOTE) 500 MG DR tablet Take 1 tablet (500 mg total) by mouth 2 (two) times daily. 07/09/23   Elmer Picker, NP  hydrochlorothiazide (HYDRODIURIL) 25 MG tablet Take 1 tablet (25 mg total) by mouth daily. 05/14/23   Jacalyn Lefevre, MD  lisinopril (ZESTRIL) 20 MG tablet Take 1 tablet (20 mg total) by mouth daily. 06/22/23 06/21/24  Hughie Closs, MD  Magnesium 300 MG CAPS Take 1 capsule by mouth daily at 6 (six) AM.    [provider]  metFORMIN (GLUCOPHAGE) 500 MG tablet Take 1 tablet (500 mg total) by mouth 2 (two) times daily with a meal. 07/09/23 08/11/23  Elmer Picker, NP    Family History Family History  Problem Relation Age of Onset   Hypertension Mother    Cancer Mother    Heart disease Father    Seizures Sister        epilepsy    Social History Social History   Tobacco Use   Smoking status: Never   Smokeless tobacco: Never  Vaping Use   Vaping status: Never  Used  Substance Use Topics   Alcohol use: Not Currently   Drug use: No     Allergies   Gluten meal and Penicillins   Review of Systems Review of Systems Per HPI  Physical Exam Triage Vital Signs ED Triage Vitals  Encounter Vitals Group     BP 08/13/23 1122 (!) 155/93     Systolic BP Percentile --      Diastolic BP Percentile --      Pulse Rate 08/13/23 1122 87     Resp 08/13/23 1122 18     Temp 08/13/23 1122 98.2 F (36.8 C)     Temp Source 08/13/23 1122 Oral     SpO2 08/13/23 1122 96 %     Weight --      Height --      Head Circumference --      Peak Flow --      Pain Score 08/13/23 1120 8     Pain Loc --      Pain Education --      Exclude from Growth Chart --    No data found.  Updated Vital Signs BP (!) 155/93 (BP Location: Right Arm)   Pulse 87   Temp 98.2 F (36.8 C) (Oral)   Resp 18   LMP 05/16/2017 (Approximate)   SpO2 96%   Visual  Acuity Right Eye Distance:   Left Eye Distance:   Bilateral Distance:    Right Eye Near:   Left Eye Near:    Bilateral Near:     Physical Exam Vitals and nursing note reviewed.  Constitutional:      General: She is not in acute distress.    Appearance: She is not ill-appearing.  Eyes:     General: Lids are normal. Lids are everted, no foreign bodies appreciated. Vision grossly intact. Gaze aligned appropriately.        Right eye: No discharge.        Left eye: No discharge.     Extraocular Movements: Extraocular movements intact.     Right eye: Normal extraocular motion.     Left eye: Normal extraocular motion.     Conjunctiva/sclera:     Right eye: Right conjunctiva is injected.     Left eye: Left conjunctiva is injected.     Pupils: Pupils are equal, round, and reactive to light.     Comments: Bilateral conjunctiva are injected.  There is no discharge in the eyes or lashes.  No periorbital swelling or erythema.  Nontender, no pain with EOM  Cardiovascular:     Rate and Rhythm: Normal rate and regular rhythm.  Pulmonary:     Effort: Pulmonary effort is normal.  Musculoskeletal:     Cervical back: Normal range of motion.  Skin:    General: Skin is warm and dry.  Neurological:     Mental Status: She is alert and oriented to person, place, and time.      UC Treatments / Results  Labs (all labs ordered are listed, but only abnormal results are displayed) Labs Reviewed - No data to display  EKG   Radiology No results found.  Procedures Procedures (including critical care time)  Medications Ordered in UC Medications - No data to display  Initial Impression / Assessment and Plan / UC Course  I have reviewed the triage vital signs and the nursing notes.  Pertinent labs & imaging results that were available during my care of the patient were reviewed by me and considered  in my medical decision making (see chart for details).  Bilateral conjunctival injection  worsening over the last 2 weeks, not improving with allergy eyedrops and red eye drops.  Advised patient discontinue using those drops in case they are worsening symptoms.  She does not have any discharge at this time, however with duration we will go ahead and treat for bacterial etiology.  Vigamox 3 times daily for 7 days.  I have also recommended follow-up with an eye specialist, contact information is provided.  Patient is agreeable to plan, no questions  Final Clinical Impressions(s) / UC Diagnoses   Final diagnoses:  Acute bacterial conjunctivitis of both eyes     Discharge Instructions      Eye drops -- 1 drop into both eyes 3 times daily, for 7 days   Please follow up with eye specialist if symptoms are persisting     ED Prescriptions     Medication Sig Dispense Auth. Provider   moxifloxacin (VIGAMOX) 0.5 % ophthalmic solution Place 1 drop into both eyes 3 (three) times daily. 3 mL Rockie Schnoor, Lurena Joiner, PA-C      PDMP not reviewed this encounter.   Kuzey Ogata, Lurena Joiner, [place] 08/13/23 1223

## 2023-08-13 NOTE — Discharge Instructions (Signed)
 Eye drops -- 1 drop into both eyes 3 times daily, for 7 days   Please follow up with eye specialist if symptoms are persisting

## 2023-08-20 ENCOUNTER — Encounter (HOSPITAL_COMMUNITY): Payer: Self-pay

## 2023-08-20 ENCOUNTER — Ambulatory Visit (HOSPITAL_COMMUNITY): Admission: EM | Admit: 2023-08-20 | Discharge: 2023-08-20 | Disposition: A | Discharge status: Home / Self Care

## 2023-08-20 DIAGNOSIS — H1013 Acute atopic conjunctivitis, bilateral: Secondary | ICD-10-CM

## 2023-08-20 DIAGNOSIS — J302 Other seasonal allergic rhinitis: Secondary | ICD-10-CM

## 2023-08-20 DIAGNOSIS — Z760 Encounter for issue of repeat prescription: Secondary | ICD-10-CM

## 2023-08-20 MED ORDER — DIVALPROEX SODIUM 500 MG PO DR TAB
500.0000 mg | DELAYED_RELEASE_TABLET | Freq: Two times a day (BID) | ORAL | 0 refills | Status: DC
Start: 2023-08-20 — End: 2023-10-14

## 2023-08-20 MED ORDER — AZELASTINE HCL 0.05 % OP SOLN
1.0000 [drp] | Freq: Two times a day (BID) | OPHTHALMIC | 2 refills | Status: DC
Start: 2023-08-20 — End: 2024-03-08

## 2023-08-20 NOTE — ED Provider Notes (Signed)
 [facility] [place]  Note:  This document was prepared using Dragon voice recognition software and may include unintentional dictation errors.  MRN: [id number] DOB: May 06, 1971  Subjective:   Alison Collins is a 53 y.o. female presenting for bilateral eye irritation, redness, watery discharge x 2 weeks.  Patient was previously treated for bacterial conjunctivitis with moxifloxacin eyedrops with no improvement of symptoms.  Patient reports occasional mucus discharge from eyes.  No pain with eye movement, foreign body sensation, vision changes.  Patient denies any known trauma or eye injury.  Patient also requiring medication refill for Depakote 500 mg.  Patient takes medication for seizures but ran out of medication and needs refill until she can see neurology for follow-up appointment.  Patient denies any recent seizure activity.  No current facility-administered medications for this encounter.  Current Outpatient Medications:    azelastine (OPTIVAR) 0.05 % ophthalmic solution, Place 1 drop into both eyes 2 (two) times daily., Disp: 6 mL, Rfl: 2   Cyanocobalamin (VITAMIN B 12) 500 MCG TABS, Take 1 tablet by mouth daily., Disp: , Rfl:    divalproex (DEPAKOTE) 500 MG DR tablet, Take 1 tablet (500 mg total) by mouth 2 (two) times daily., Disp: 60 tablet, Rfl: 0   hydrochlorothiazide (HYDRODIURIL) 25 MG tablet, Take 1 tablet (25 mg total) by mouth daily., Disp: 30 tablet, Rfl: 1   lisinopril (ZESTRIL) 20 MG tablet, Take 1 tablet (20 mg total) by mouth daily., Disp: 30 tablet, Rfl: 0   Magnesium 300 MG CAPS, Take 1 capsule by mouth daily at 6 (six) AM., Disp: , Rfl:    metFORMIN (GLUCOPHAGE) 500 MG tablet, Take 1 tablet (500 mg total) by mouth 2 (two) times daily with a meal., Disp: 66 tablet, Rfl: 0   moxifloxacin (VIGAMOX) 0.5 % ophthalmic solution, Place 1 drop into both eyes 3 (three) times daily., Disp: 3 mL, Rfl: 0   Allergies  Allergen Reactions   Gluten Meal    Penicillins  Swelling    Pt states medication gives her bruises, no SOB or hives/itching. Childhood reaction  Has patient had a PCN reaction causing immediate rash, facial/tongue/throat swelling, SOB or lightheadedness with hypotension: Yes Has patient had a PCN reaction causing severe rash involving mucus membranes or skin necrosis: No Has patient had a PCN reaction that required hospitalization:No Has patient had a PCN reaction occurring within the last 10 years: /No If all of the above answers are "NO", then may proceed    Past Medical History:  Diagnosis Date   Anemia    GDM (gestational diabetes mellitus)    Hypertension    Migraine    Non-insulin dependent type 2 diabetes mellitus (HCC) 06/19/2023   Normal spontaneous vaginal delivery    Seizures (HCC)    Spontaneous abortion    2     Past Surgical History:  Procedure Laterality Date   APPLICATION OF CRANIAL NAVIGATION Right 06/28/2023   Procedure: APPLICATION OF CRANIAL NAVIGATION;  Surgeon: Bedelia Person, MD;  Location[facility] OR;  Service: Neurosurgery;  Laterality: Right;   BREAST SURGERY     LIPOSUCTION      Family History  Problem Relation Age of Onset   Hypertension Mother    Cancer Mother    Heart disease Father    Seizures Sister        epilepsy    Social History   Tobacco Use   Smoking status: Never   Smokeless tobacco: Never  Vaping Use   Vaping status: Never Used  Substance Use Topics   Alcohol use: Not Currently   Drug use: No    ROS Refer to HPI for ROS details.  Objective:   Vitals: BP (!) 163/106 (BP Location: Left Arm)   Pulse 73   Temp 97.8 F (36.6 C) (Oral)   Resp 16   Ht 5\' 2"  (1.575 m)   Wt 220 lb 7.4 oz (100 kg)   LMP 05/16/2017 (Approximate)   SpO2 96%   BMI 40.32 kg/m   Physical Exam Vitals and nursing note reviewed.  Constitutional:      General: She is not in acute distress.    Appearance: Normal appearance. She is well-developed. She is not ill-appearing or toxic-appearing.   HENT:     Head: Normocephalic.  Eyes:     General: Lids are normal. Vision grossly intact. Gaze aligned appropriately. No visual field deficit.       Right eye: Discharge present.        Left eye: Discharge present.    Extraocular Movements: Extraocular movements intact.     Right eye: Normal extraocular motion.     Left eye: Normal extraocular motion.     Conjunctiva/sclera:     Right eye: Right conjunctiva is injected. No exudate.    Left eye: Left conjunctiva is injected. No exudate.    Pupils: Pupils are equal, round, and reactive to light.  Cardiovascular:     Rate and Rhythm: Normal rate.  Pulmonary:     Effort: Pulmonary effort is normal. No respiratory distress.  Musculoskeletal:     Cervical back: Neck supple.  Skin:    General: Skin is warm and dry.  Neurological:     General: No focal deficit present.     Mental Status: She is alert and oriented to person, place, and time.  Psychiatric:        Mood and Affect: Mood normal.     Procedures  No results found for this or any previous visit (from the past 24 hours).  Assessment and Plan :   PDMP not reviewed this encounter.  1. Allergic conjunctivitis of both eyes   2. Seasonal allergies   3. Medication refill      1. Allergic conjunctivitis of both eyes (Primary) - azelastine (OPTIVAR) 0.05 % ophthalmic solution; Place 1 drop into both eyes 2 (two) times daily.   -Store eyedrops in the fridge rater to decrease inflammation and irritation to the eyes with cold solution -Discontinue use of moxifloxacin eyedrops as symptoms are very unlikely to be caused by bacterial cause.  2. Seasonal allergies -Take daily oral antihistamine such as Claritin, Allegra, Zyrtec for seasonal allergy symptoms  3. Medication refill - divalproex (DEPAKOTE) 500 MG DR tablet; Take 1 tablet (500 mg total) by mouth 2 (two) times daily.   -Follow-up with primary care provider or neurologist for further evaluation and prescription of  medication.  Lucky Cowboy   [place], [place] B, [place] 08/20/23 1351

## 2023-08-20 NOTE — ED Triage Notes (Signed)
 Patient here today with c/o bilat eye redness and itching X 2 weeks. She has been taking some medication that has been prescribed with no improvement.

## 2023-08-20 NOTE — Discharge Instructions (Addendum)
 1. Allergic conjunctivitis of both eyes (Primary) - azelastine (OPTIVAR) 0.05 % ophthalmic solution; Place 1 drop into both eyes 2 (two) times daily.   -Store eyedrops in the fridge rater to decrease inflammation and irritation to the eyes with cold solution -Discontinue use of moxifloxacin eyedrops as symptoms are very unlikely to be caused by bacterial cause.  2. Seasonal allergies -Take daily oral antihistamine such as Claritin, Allegra, Zyrtec for seasonal allergy symptoms  3. Medication refill - divalproex (DEPAKOTE) 500 MG DR tablet; Take 1 tablet (500 mg total) by mouth 2 (two) times daily.   -Follow-up with primary care provider or neurologist for further evaluation and prescription of medication.

## 2023-10-14 ENCOUNTER — Emergency Department (HOSPITAL_BASED_OUTPATIENT_CLINIC_OR_DEPARTMENT_OTHER): Payer: Self-pay

## 2023-10-14 ENCOUNTER — Emergency Department (HOSPITAL_BASED_OUTPATIENT_CLINIC_OR_DEPARTMENT_OTHER)
Admission: EM | Admit: 2023-10-14 | Discharge: 2023-10-14 | Disposition: A | Discharge status: Home / Self Care | Payer: Self-pay | Attending: Emergency Medicine | Admitting: Emergency Medicine

## 2023-10-14 ENCOUNTER — Encounter (HOSPITAL_BASED_OUTPATIENT_CLINIC_OR_DEPARTMENT_OTHER): Payer: Self-pay | Admitting: Emergency Medicine

## 2023-10-14 ENCOUNTER — Other Ambulatory Visit: Payer: Self-pay

## 2023-10-14 DIAGNOSIS — Z760 Encounter for issue of repeat prescription: Secondary | ICD-10-CM

## 2023-10-14 DIAGNOSIS — R569 Unspecified convulsions: Secondary | ICD-10-CM | POA: Insufficient documentation

## 2023-10-14 DIAGNOSIS — E119 Type 2 diabetes mellitus without complications: Secondary | ICD-10-CM | POA: Insufficient documentation

## 2023-10-14 DIAGNOSIS — Z7984 Long term (current) use of oral hypoglycemic drugs: Secondary | ICD-10-CM | POA: Insufficient documentation

## 2023-10-14 LAB — COMPREHENSIVE METABOLIC PANEL WITH GFR
ALT: 11 U/L (ref 0–44)
AST: 20 U/L (ref 15–41)
Albumin: 4.5 g/dL (ref 3.5–5.0)
Alkaline Phosphatase: 85 U/L (ref 38–126)
Anion gap: 13 (ref 5–15)
BUN: 19 mg/dL (ref 6–20)
CO2: 23 mmol/L (ref 22–32)
Calcium: 9.9 mg/dL (ref 8.9–10.3)
Chloride: 103 mmol/L (ref 98–111)
Creatinine, Ser: 0.8 mg/dL (ref 0.44–1.00)
GFR, Estimated: >60 mL/min (ref >60)
Glucose, Bld: 122 mg/dL — ABNORMAL HIGH (ref 70–99)
Potassium: 3.8 mmol/L (ref 3.5–5.1)
Sodium: 139 mmol/L (ref 135–145)
Total Bilirubin: 0.3 mg/dL (ref 0.0–1.2)
Total Protein: 7.4 g/dL (ref 6.5–8.1)

## 2023-10-14 LAB — CBC WITH DIFFERENTIAL/PLATELET
Abs Immature Granulocytes: 0.02 10*3/uL (ref 0.00–0.07)
Basophils Absolute: 0 10*3/uL (ref 0.0–0.1)
Basophils Relative: 0 %
Eosinophils Absolute: 0 10*3/uL (ref 0.0–0.5)
Eosinophils Relative: 0 %
HCT: 42 % (ref 36.0–46.0)
Hemoglobin: 14.3 g/dL (ref 12.0–15.0)
Immature Granulocytes: 0 %
Lymphocytes Relative: 29 %
Lymphs Abs: 2.3 10*3/uL (ref 0.7–4.0)
MCH: 29.3 pg (ref 26.0–34.0)
MCHC: 34 g/dL (ref 30.0–36.0)
MCV: 86.1 fL (ref 80.0–100.0)
Monocytes Absolute: 0.7 10*3/uL (ref 0.1–1.0)
Monocytes Relative: 9 %
Neutro Abs: 4.7 10*3/uL (ref 1.7–7.7)
Neutrophils Relative %: 62 %
Platelets: 247 10*3/uL (ref 150–400)
RBC: 4.88 MIL/uL (ref 3.87–5.11)
RDW: 14.5 % (ref 11.5–15.5)
WBC: 7.8 10*3/uL (ref 4.0–10.5)
nRBC: 0 % (ref 0.0–0.2)

## 2023-10-14 MED ORDER — HYDROCHLOROTHIAZIDE 25 MG PO TABS: 25.0000 mg | ORAL_TABLET | Freq: Every day | ORAL | 1 refills | Status: DC

## 2023-10-14 MED ORDER — DIVALPROEX SODIUM 250 MG PO DR TAB
500.0000 mg | DELAYED_RELEASE_TABLET | Freq: Once | ORAL | Status: AC
  Administered 2023-10-14: 500 mg via ORAL
  Filled 2023-10-14: qty 2

## 2023-10-14 MED ORDER — DIVALPROEX SODIUM 500 MG PO DR TAB
500.0000 mg | DELAYED_RELEASE_TABLET | Freq: Two times a day (BID) | ORAL | 1 refills | Status: DC
Start: 2023-10-14 — End: 2023-12-04

## 2023-10-14 MED ORDER — LISINOPRIL 20 MG PO TABS
20.0000 mg | ORAL_TABLET | Freq: Every day | ORAL | 1 refills | Status: AC
Start: 2023-10-14 — End: 2024-10-13

## 2023-10-14 NOTE — ED Provider Notes (Signed)
 [place] EMERGENCY DEPARTMENT AT [facility] Provider Note   CSN: [id number] Arrival date & time: 10/14/23  0300     History  Chief Complaint  Patient presents with   Seizures    Alison Collins is a 53 y.o. female.  Patient is a 53 year old female with past medical history of seizure disorder, hydrocephalus status ETV procedure in December, type 2 diabetes.  Patient presenting today for evaluation of seizure.  She was asleep with her husband when she began shaking.  This woke her husband who noticed her shaking for approximately 90 seconds, then spontaneously resolved.  She was then disoriented and confused for another 5 or 6 minutes before regaining consciousness.  Patient has no recollection of the incident.  She does have marks to her cheek and lip.  Patient tells me she has not been taking her Depakote  recently because she is concerned about this medication potentially damaging her liver.       Home Medications Prior to Admission medications   Medication Sig Start Date End Date Taking? Authorizing Provider  azelastine  (OPTIVAR ) 0.05 % ophthalmic solution Place 1 drop into both eyes 2 (two) times daily. 08/20/23   Reddick, Johnathan B, NP  Cyanocobalamin  (VITAMIN B 12) 500 MCG TABS Take 1 tablet by mouth daily.    [provider]  divalproex  (DEPAKOTE ) 500 MG DR tablet Take 1 tablet (500 mg total) by mouth 2 (two) times daily. 08/20/23   Reddick, Johnathan B, NP  hydrochlorothiazide  (HYDRODIURIL ) 25 MG tablet Take 1 tablet (25 mg total) by mouth daily. 05/14/23   Sueellen Emery, MD  lisinopril  (ZESTRIL ) 20 MG tablet Take 1 tablet (20 mg total) by mouth daily. 06/22/23 06/21/24  Modena Andes, MD  Magnesium 300 MG CAPS Take 1 capsule by mouth daily at 6 (six) AM.    [provider]  metFORMIN  (GLUCOPHAGE ) 500 MG tablet Take 1 tablet (500 mg total) by mouth 2 (two) times daily with a meal. 07/09/23 08/11/23  Imogene Mana, NP  moxifloxacin  (VIGAMOX ) 0.5 %  ophthalmic solution Place 1 drop into both eyes 3 (three) times daily. 08/13/23   Rising, Ivette Marks, PA-C      Allergies    Gluten meal and Penicillins    Review of Systems   Review of Systems  All other systems reviewed and are negative.   Physical Exam Updated Vital Signs BP (!) 172/109   Pulse (!) 110   Temp 98.3 F (36.8 C) (Oral)   Resp (!) 23   Ht 5\' 2"  (1.575 m)   Wt 99.8 kg   LMP 05/16/2017 (Approximate)   SpO2 95%   BMI 40.24 kg/m  Physical Exam Vitals and nursing note reviewed.  Constitutional:      General: She is not in acute distress.    Appearance: She is well-developed. She is not diaphoretic.  HENT:     Head: Normocephalic and atraumatic.  Eyes:     Extraocular Movements: Extraocular movements intact.     Pupils: Pupils are equal, round, and reactive to light.  Cardiovascular:     Rate and Rhythm: Normal rate and regular rhythm.     Heart sounds: No murmur heard.    No friction rub. No gallop.  Pulmonary:     Effort: Pulmonary effort is normal. No respiratory distress.     Breath sounds: Normal breath sounds. No wheezing.  Abdominal:     General: Bowel sounds are normal. There is no distension.     Palpations: Abdomen is soft.  Tenderness: There is no abdominal tenderness.  Musculoskeletal:        General: Normal range of motion.     Cervical back: Normal range of motion and neck supple.  Skin:    General: Skin is warm and dry.  Neurological:     General: No focal deficit present.     Mental Status: She is alert and oriented to person, place, and time. Mental status is at baseline.     Cranial Nerves: No cranial nerve deficit.     Sensory: No sensory deficit.     Motor: No weakness.     Coordination: Coordination normal.     ED Results / Procedures / Treatments   Labs (all labs ordered are listed, but only abnormal results are displayed) Labs Reviewed  CBC WITH DIFFERENTIAL/PLATELET  COMPREHENSIVE METABOLIC PANEL WITH GFR    EKG EKG  Interpretation Date/Time:  Friday Oct 14 2023 03:09:46 EDT Ventricular Rate:  111 PR Interval:  176 QRS Duration:  89 QT Interval:  316 QTC Calculation: 430 R Axis:   -29  Text Interpretation: Sinus tachycardia Left atrial enlargement Borderline left axis deviation Probable anteroseptal infarct, old Confirmed by Orvilla Blander ([id number]) on 10/14/2023 3:12:38 AM  Radiology No results found.  Procedures Procedures    Medications Ordered in ED Medications - No data to display  ED Course/ Medical Decision Making/ A&P  Patient is a 53 year old female presenting with seizure as described in the HPI.  Patient arrives with stable vital signs and is afebrile.  She is back to her neurologic baseline and neuroexam is intact.  Laboratory studies obtained including CBC and CMP, both of which are unremarkable.  CT scan of the head showing no acute process.  Patient given a dose of her Depakote  here as she has been off of this medication for several weeks due to insurance reasons.  I will refill this medication for her.  She has also requested refills of her lisinopril  and hydrochlorothiazide  which I will provide.  Final Clinical Impression(s) / ED Diagnoses Final diagnoses:  None    Rx / DC Orders ED Discharge Orders     None         Orvilla Blander, MD 10/14/23 [phone]

## 2023-10-14 NOTE — ED Triage Notes (Addendum)
 Patient arrives with family who reports witnessed seizure for approx 1 minute at 1:30am. Post ictal afterwards. History of seizures - not currently taking medication due to insurance issues. Oral trauma present on right side. Last seizure approx 3 months ago. GCS 15.

## 2023-10-14 NOTE — Discharge Instructions (Addendum)
 Resume taking your Depakote  as previously prescribed.  Follow-up with primary doctor and return to the ER if you experience any new and/or concerning issues.

## 2023-11-22 ENCOUNTER — Other Ambulatory Visit: Payer: Self-pay | Admitting: Family Medicine

## 2023-11-22 DIAGNOSIS — Z760 Encounter for issue of repeat prescription: Secondary | ICD-10-CM

## 2023-11-22 NOTE — Telephone Encounter (Signed)
 Copied from CRM [id number]. Topic: Clinical - Medication Refill >> Nov 22, 2023  1:07 PM Mesmerise C wrote: Medication:  hydrochlorothiazide  (HYDRODIURIL ) 25 MG tablet divalproex  (DEPAKOTE ) 500 MG DR tablet lisinopril  (ZESTRIL ) 20 MG tablet  Has the patient contacted their pharmacy? No (Agent: If no, request that the patient contact the pharmacy for the refill. If patient does not wish to contact the pharmacy document the reason why and proceed with request.) (Agent: If yes, when and what did the pharmacy advise?)  This is the patient's preferred pharmacy:  [facility] [address] [place] - [address] [place] [address] Phone: [phone] Fax: [phone]  Is this the correct pharmacy for this prescription? Yes If no, delete pharmacy and type the correct one.   Has the prescription been filled recently? No  Is the patient out of the medication? Yes  Has the patient been seen for an appointment in the last year OR does the patient have an upcoming appointment? Yes  Can we respond through MyChart? Yes  Agent: Please be advised that Rx refills may take up to 3 business days. We ask that you follow-up with your pharmacy.

## 2023-11-23 ENCOUNTER — Telehealth: Payer: Self-pay

## 2023-11-23 MED ORDER — HYDROCHLOROTHIAZIDE 25 MG PO TABS: 25.0000 mg | ORAL_TABLET | Freq: Every day | ORAL | 0 refills | Status: DC

## 2023-11-23 MED ORDER — LISINOPRIL 20 MG PO TABS: 20.0000 mg | ORAL_TABLET | Freq: Every day | ORAL | 0 refills | Status: DC

## 2023-11-23 NOTE — Telephone Encounter (Signed)
 Made phone call note for pt to call and make appointment.

## 2023-12-04 ENCOUNTER — Emergency Department (HOSPITAL_BASED_OUTPATIENT_CLINIC_OR_DEPARTMENT_OTHER)
Admission: EM | Admit: 2023-12-04 | Discharge: 2023-12-04 | Disposition: A | Discharge status: Home / Self Care | Payer: Self-pay | Attending: Emergency Medicine | Admitting: Emergency Medicine

## 2023-12-04 ENCOUNTER — Encounter (HOSPITAL_BASED_OUTPATIENT_CLINIC_OR_DEPARTMENT_OTHER): Payer: Self-pay | Admitting: Emergency Medicine

## 2023-12-04 DIAGNOSIS — R569 Unspecified convulsions: Secondary | ICD-10-CM | POA: Insufficient documentation

## 2023-12-04 DIAGNOSIS — G40909 Epilepsy, unspecified, not intractable, without status epilepticus: Secondary | ICD-10-CM

## 2023-12-04 DIAGNOSIS — Z79899 Other long term (current) drug therapy: Secondary | ICD-10-CM | POA: Insufficient documentation

## 2023-12-04 DIAGNOSIS — I10 Essential (primary) hypertension: Secondary | ICD-10-CM | POA: Insufficient documentation

## 2023-12-04 DIAGNOSIS — E119 Type 2 diabetes mellitus without complications: Secondary | ICD-10-CM | POA: Insufficient documentation

## 2023-12-04 DIAGNOSIS — Z760 Encounter for issue of repeat prescription: Secondary | ICD-10-CM | POA: Insufficient documentation

## 2023-12-04 DIAGNOSIS — Z7984 Long term (current) use of oral hypoglycemic drugs: Secondary | ICD-10-CM | POA: Insufficient documentation

## 2023-12-04 MED ORDER — DIVALPROEX SODIUM 500 MG PO DR TAB
500.0000 mg | DELAYED_RELEASE_TABLET | Freq: Two times a day (BID) | ORAL | 1 refills | Status: DC
Start: 2023-12-04 — End: 2024-01-24

## 2023-12-04 NOTE — ED Triage Notes (Signed)
 Need reill on depakote .  Hasn't been able to get an appt yet and need a a refill Doing well on meds  Was seen for seizure due to not taking meds 10/14/2023

## 2023-12-04 NOTE — Discharge Instructions (Signed)
 Follow-up with your neurosurgeon and neurology.  Follow-up with the primary care doctor also.

## 2023-12-04 NOTE — ED Notes (Signed)
 Reviewed AVS/discharge instruction with patient. Time allotted for and all questions answered. Patient is agreeable for d/c and escorted to ed exit by staff.

## 2023-12-04 NOTE — ED Provider Notes (Signed)
 [place] EMERGENCY DEPARTMENT AT [facility] Provider Note   CSN: [id number] Arrival date & time: 12/04/23  1432     Patient presents with: Medication Refill   Alison Collins is a 53 y.o. female.    Medication Refill Patient with history of hydrocephalus.  Presents for refill of patient's Depakote .  States does not have a neurologist.  Last seizure was over a month ago and had medicine refilled in the ER.  Still has some pills leftover but no appointment.    Past Medical History:  Diagnosis Date   Anemia    GDM (gestational diabetes mellitus)    Hypertension    Migraine    Non-insulin  dependent type 2 diabetes mellitus (HCC) 06/19/2023   Normal spontaneous vaginal delivery    Seizures (HCC)    Spontaneous abortion    2   Past Surgical History:  Procedure Laterality Date   APPLICATION OF CRANIAL NAVIGATION Right 06/28/2023   Procedure: APPLICATION OF CRANIAL NAVIGATION;  Surgeon: Debby Dorn MATSU, MD;  Location: [facility] OR;  Service: Neurosurgery;  Laterality: Right;   BREAST SURGERY     LIPOSUCTION       Prior to Admission medications   Medication Sig Start Date End Date Taking? Authorizing Provider  azelastine  (OPTIVAR ) 0.05 % ophthalmic solution Place 1 drop into both eyes 2 (two) times daily. 08/20/23   Reddick, Johnathan B, NP  Cyanocobalamin  (VITAMIN B 12) 500 MCG TABS Take 1 tablet by mouth daily.    [provider]  divalproex  (DEPAKOTE ) 500 MG DR tablet Take 1 tablet (500 mg total) by mouth 2 (two) times daily. 12/04/23   Patsey Lot, MD  hydrochlorothiazide  (HYDRODIURIL ) 25 MG tablet Take 1 tablet (25 mg total) by mouth daily. 11/23/23   Billy Philippe SAUNDERS, NP  lisinopril  (ZESTRIL ) 20 MG tablet Take 1 tablet (20 mg total) by mouth daily. 11/23/23 11/22/24  Billy Philippe SAUNDERS, NP  Magnesium 300 MG CAPS Take 1 capsule by mouth daily at 6 (six) AM.    [provider]  metFORMIN  (GLUCOPHAGE ) 500 MG tablet Take 1 tablet (500 mg total)  by mouth 2 (two) times daily with a meal. 07/09/23 08/11/23  Remi Pippin, NP  moxifloxacin  (VIGAMOX ) 0.5 % ophthalmic solution Place 1 drop into both eyes 3 (three) times daily. 08/13/23   Rising, Asberry, PA-C    Allergies: Gluten meal and Penicillins    Review of Systems  Updated Vital Signs BP (!) 167/82 (BP Location: Right Arm)   Pulse 73   Temp 98.4 F (36.9 C)   Resp 18   LMP 05/16/2017 (Approximate)   SpO2 97%   Physical Exam  Neurological:     Mental Status: Mental status is at baseline.     (all labs ordered are listed, but only abnormal results are displayed) Labs Reviewed - No data to display  EKG: None  Radiology: No results found.   Procedures   Medications Ordered in the ED - No data to display                                  Medical Decision Making Risk Prescription drug management.   Patient with history of seizures and communicating hydrocephalus postsurgery.  Presents for medication refill.  Refill given but instructed patient and family ember on importance of follow-up with specialists.  Will discharge home.      Final diagnoses:  Medication refill  Seizure disorder (HCC)  ED Discharge Orders          Ordered    Ambulatory referral to Neurology       Comments: An appointment is requested in approximately: 2 weeks   12/04/23 1512    divalproex  (DEPAKOTE ) 500 MG DR tablet  2 times daily        12/04/23 1512               Patsey Lot, MD 12/04/23 1515

## 2023-12-15 ENCOUNTER — Encounter (HOSPITAL_BASED_OUTPATIENT_CLINIC_OR_DEPARTMENT_OTHER): Payer: Self-pay

## 2023-12-15 ENCOUNTER — Emergency Department (HOSPITAL_BASED_OUTPATIENT_CLINIC_OR_DEPARTMENT_OTHER)
Admission: EM | Admit: 2023-12-15 | Discharge: 2023-12-15 | Disposition: A | Discharge status: Home / Self Care | Payer: Self-pay | Attending: Emergency Medicine | Admitting: Emergency Medicine

## 2023-12-15 ENCOUNTER — Other Ambulatory Visit: Payer: Self-pay

## 2023-12-15 DIAGNOSIS — Z7984 Long term (current) use of oral hypoglycemic drugs: Secondary | ICD-10-CM | POA: Insufficient documentation

## 2023-12-15 DIAGNOSIS — N3001 Acute cystitis with hematuria: Secondary | ICD-10-CM | POA: Insufficient documentation

## 2023-12-15 DIAGNOSIS — Z79899 Other long term (current) drug therapy: Secondary | ICD-10-CM | POA: Insufficient documentation

## 2023-12-15 LAB — URINALYSIS, ROUTINE W REFLEX MICROSCOPIC
Bilirubin Urine: NEGATIVE
Glucose, UA: NEGATIVE mg/dL
Ketones, ur: NEGATIVE mg/dL
Nitrite: NEGATIVE
Protein, ur: 100 mg/dL — AB
RBC / HPF: >50 RBC/hpf (ref 0–5)
Specific Gravity, Urine: 1.014 (ref 1.005–1.030)
WBC, UA: >50 WBC/hpf (ref 0–5)
pH: 7 (ref 5.0–8.0)

## 2023-12-15 MED ORDER — CEPHALEXIN 500 MG PO CAPS: 500.0000 mg | ORAL_CAPSULE | Freq: Four times a day (QID) | ORAL | 0 refills | Status: DC

## 2023-12-15 MED ORDER — PHENAZOPYRIDINE HCL 100 MG PO TABS
200.0000 mg | ORAL_TABLET | Freq: Once | ORAL | Status: AC
  Administered 2023-12-15: 200 mg via ORAL
  Filled 2023-12-15: qty 2

## 2023-12-15 MED ORDER — CEPHALEXIN 250 MG PO CAPS
500.0000 mg | ORAL_CAPSULE | Freq: Once | ORAL | Status: AC
  Administered 2023-12-15: 500 mg via ORAL
  Filled 2023-12-15: qty 2

## 2023-12-15 MED ORDER — PHENAZOPYRIDINE HCL 200 MG PO TABS: 200.0000 mg | ORAL_TABLET | Freq: Three times a day (TID) | ORAL | 0 refills | Status: DC | PRN

## 2023-12-15 NOTE — ED Provider Notes (Signed)
 [place] EMERGENCY DEPARTMENT AT [facility] Provider Note   CSN: [id number] Arrival date & time: 12/15/23  1910     Patient presents with: Dysuria and Urinary Frequency   Alison Collins is a 53 y.o. female.   HPI Patient reports she is having intense burning and urgency with urination.  She reports she thinks is because on 4 July she walked outside in the heat for really long time and only drink 2 bottles of water.  Then a couple of days later she was preparing herself to be sexually active with her husband.  Reports she took a little bit of a apple cider vinegar and made a douche out of it.  After that she started getting a lot of burning and discomfort with urination.  She thought maybe it was a yeast infection so she tried a couple of days of Monistat.  Symptoms however did not get better and now she is got intense urgency for urination and burning.  She denies fever.  She has not had any vomiting.  She reports mild back pain and mild suprapubic discomfort.  She denies any abnormal vaginal bleeding or discharge at this time.    Prior to Admission medications   Medication Sig Start Date End Date Taking? Authorizing Provider  cephALEXin  (KEFLEX ) 500 MG capsule Take 1 capsule (500 mg total) by mouth 4 (four) times daily. 12/15/23  Yes Armenta Canning, MD  phenazopyridine  (PYRIDIUM ) 200 MG tablet Take 1 tablet (200 mg total) by mouth 3 (three) times daily as needed for pain. 12/15/23  Yes Armenta Canning, MD  azelastine  (OPTIVAR ) 0.05 % ophthalmic solution Place 1 drop into both eyes 2 (two) times daily. 08/20/23   Reddick, Johnathan B, NP  Cyanocobalamin  (VITAMIN B 12) 500 MCG TABS Take 1 tablet by mouth daily.    [provider]  divalproex  (DEPAKOTE ) 500 MG DR tablet Take 1 tablet (500 mg total) by mouth 2 (two) times daily. 12/04/23   Patsey Lot, MD  hydrochlorothiazide  (HYDRODIURIL ) 25 MG tablet Take 1 tablet (25 mg total) by mouth daily. 11/23/23   Billy Philippe SAUNDERS, NP  lisinopril  (ZESTRIL ) 20 MG tablet Take 1 tablet (20 mg total) by mouth daily. 11/23/23 11/22/24  Billy Philippe SAUNDERS, NP  Magnesium 300 MG CAPS Take 1 capsule by mouth daily at 6 (six) AM.    [provider]  metFORMIN  (GLUCOPHAGE ) 500 MG tablet Take 1 tablet (500 mg total) by mouth 2 (two) times daily with a meal. 07/09/23 08/11/23  Remi Pippin, NP  moxifloxacin  (VIGAMOX ) 0.5 % ophthalmic solution Place 1 drop into both eyes 3 (three) times daily. 08/13/23   Rising, Asberry, PA-C    Allergies: Gluten meal and Penicillins    Review of Systems  Updated Vital Signs BP (!) 183/100   Pulse 81   Temp 97.8 F (36.6 C) (Oral)   Resp 18   Ht 5' 4 (1.626 m)   Wt 86.2 kg   LMP 05/16/2017 (Approximate)   SpO2 94%   BMI 32.61 kg/m   Physical Exam Constitutional:      Comments: Patient is alert nontoxic.  Mental status clear.  No respiratory distress.  HENT:     Mouth/Throat:     Pharynx: Oropharynx is clear.  Eyes:     Extraocular Movements: Extraocular movements intact.  Cardiovascular:     Rate and Rhythm: Normal rate and regular rhythm.  Pulmonary:     Effort: Pulmonary effort is normal.     Breath sounds: Normal  breath sounds.  Abdominal:     Comments: Abdomen soft.  No guarding.  Patient endorses minimal discomfort to palpation in the suprapubic area.  She denies any flank pain to percussion.  Musculoskeletal:        General: Normal range of motion.  Skin:    General: Skin is warm and dry.  Neurological:     General: No focal deficit present.     Mental Status: She is oriented to person, place, and time.     Coordination: Coordination normal.  Psychiatric:        Mood and Affect: Mood normal.     (all labs ordered are listed, but only abnormal results are displayed) Labs Reviewed  URINALYSIS, ROUTINE W REFLEX MICROSCOPIC - Abnormal; Notable for the following components:      Result Value   APPearance HAZY (*)    Hgb urine dipstick LARGE (*)     Protein, ur 100 (*)    Leukocytes,Ua SMALL (*)    Bacteria, UA RARE (*)    All other components within normal limits  URINE CULTURE    EKG: None  Radiology: No results found.   Procedures   Medications Ordered in the ED  cephALEXin  (KEFLEX ) capsule 500 mg (500 mg Oral Given 12/15/23 2119)  phenazopyridine  (PYRIDIUM ) tablet 200 mg (200 mg Oral Given 12/15/23 2119)                                    Medical Decision Making Amount and/or Complexity of Data Reviewed Labs: ordered.   Patient presents with symptoms as outlined above.  Urinalysis is grossly positive with greater than 50 white cells and greater than 50 red cells.  Patient does not have flank pain or fever.  At this time, patient does not have clinical signs of pyelonephritis.  Patient did trace her symptoms back to using an apple cider vinegar douche.  We discussed possible pelvic infection.  At this time patient does not suspect she has pelvic infection and denies vaginal discharge.  She does not wish to do a pelvic exam.  Patient prefers to treat what she thinks is a urinary tract infection and if it does not resolve with that treatment will consider recheck for possible pelvic infection.  At this time we will treat with Keflex  and Pyridium .     Final diagnoses:  Acute cystitis with hematuria    ED Discharge Orders          Ordered    cephALEXin  (KEFLEX ) 500 MG capsule  4 times daily        12/15/23 2117    phenazopyridine  (PYRIDIUM ) 200 MG tablet  3 times daily PRN        12/15/23 2117               Armenta Canning, MD 12/15/23 2123

## 2023-12-15 NOTE — ED Triage Notes (Signed)
 Pt POV reporting increased urinary frequency, flank pain, and dysuria x1 week, concerned for UTI.

## 2023-12-15 NOTE — Discharge Instructions (Signed)
 1.  Take Keflex  4 times a day as prescribed. 2.  You may take the Pyridium  up to 3 times a day for pain with urination.  Once your symptoms are improved, you do not need to take all of this medication.  It will make your urine bright orange. 3.  You may take extra strength Tylenol  as well for pain as needed at home. 4.  Return immediately if you get a fever, increasing abdominal pain or back pain or other concerning changes. 5.  Follow-up with your doctor for recheck in 3 to 5 days.

## 2023-12-19 LAB — URINE CULTURE: Culture: >=100000 — AB

## 2023-12-20 ENCOUNTER — Telehealth (HOSPITAL_BASED_OUTPATIENT_CLINIC_OR_DEPARTMENT_OTHER): Payer: Self-pay | Admitting: *Deleted

## 2023-12-20 NOTE — Telephone Encounter (Signed)
 Post ED Visit - Positive Culture Follow-up  Culture report reviewed by antimicrobial stewardship pharmacist: [facility] [x]  Koren Or, Pharm.D. []  Venetia Gully, Pharm.D., BCPS AQ-ID []  Garrel Crews, Pharm.D., BCPS []  Almarie Lunger, [address].D., BCPS []  [place], [place].D., BCPS, AAHIVP []  Rosaline Bihari, Pharm.D., BCPS, AAHIVP []  Vernell Meier, PharmD, BCPS []  Latanya Hint, PharmD, BCPS []  Donald Medley, PharmD, BCPS []  Rocky Bold, PharmD []  Dorothyann Alert, PharmD, BCPS []  Morene Babe, PharmD  [facility] []  Rosaline Edison, PharmD []  Romona Bliss, PharmD []  Dolphus Roller, PharmD []  Veva Seip, Rph []  Vernell Daunt) Leonce, PharmD []  Eva Allis, PharmD []  Rosaline Millet, PharmD []  Iantha Batch, PharmD []  Arvin Gauss, PharmD []  Wanda Hasting, PharmD []  Ronal Rav, PharmD []  Rocky Slade, PharmD []  Bard Jeans, PharmD   Positive urine culture Treated with cephalexin , organism sensitive to the same and no further patient follow-up is required at this time.  Lorita Barnie Pereyra 12/20/2023, 10:37 AM

## 2024-01-24 ENCOUNTER — Other Ambulatory Visit: Payer: Self-pay

## 2024-01-24 ENCOUNTER — Telehealth: Payer: Self-pay

## 2024-01-24 DIAGNOSIS — Z760 Encounter for issue of repeat prescription: Secondary | ICD-10-CM

## 2024-01-24 MED ORDER — DIVALPROEX SODIUM 500 MG PO DR TAB: 500.0000 mg | DELAYED_RELEASE_TABLET | Freq: Two times a day (BID) | ORAL | 0 refills | Status: DC

## 2024-01-24 NOTE — Telephone Encounter (Signed)
 Information routed to wrong pool.

## 2024-01-24 NOTE — Telephone Encounter (Signed)
 Noted called pt a refilled medication till appointment.

## 2024-01-24 NOTE — Telephone Encounter (Signed)
 Copied from CRM [id number]. Topic: Clinical - Medication Question >> Jan 24, 2024 12:48 PM Dedra B wrote: Reason for CRM: Pt spouse, Oneil Sharps, called in saying that she has a new pt appt scheduled for 9/12. However, pt is running low on her divalproex  and has enough to last until Saturday or Sunday. He is concerned about her having bad seizures if she runs out. Pls call pt at  [phone]

## 2024-02-17 ENCOUNTER — Ambulatory Visit: Payer: Self-pay | Admitting: Neurology

## 2024-02-19 ENCOUNTER — Other Ambulatory Visit: Payer: Self-pay | Admitting: Family Medicine

## 2024-02-19 DIAGNOSIS — Z760 Encounter for issue of repeat prescription: Secondary | ICD-10-CM

## 2024-03-02 ENCOUNTER — Emergency Department (HOSPITAL_BASED_OUTPATIENT_CLINIC_OR_DEPARTMENT_OTHER)
Admission: EM | Admit: 2024-03-02 | Discharge: 2024-03-02 | Disposition: A | Discharge status: Home / Self Care | Payer: Self-pay | Attending: Emergency Medicine | Admitting: Emergency Medicine

## 2024-03-02 ENCOUNTER — Encounter (HOSPITAL_BASED_OUTPATIENT_CLINIC_OR_DEPARTMENT_OTHER): Payer: Self-pay | Admitting: Emergency Medicine

## 2024-03-02 ENCOUNTER — Other Ambulatory Visit: Payer: Self-pay

## 2024-03-02 DIAGNOSIS — Z760 Encounter for issue of repeat prescription: Secondary | ICD-10-CM | POA: Insufficient documentation

## 2024-03-02 MED ORDER — DIVALPROEX SODIUM 500 MG PO DR TAB: 500.0000 mg | DELAYED_RELEASE_TABLET | Freq: Two times a day (BID) | ORAL | 0 refills | Status: DC

## 2024-03-02 NOTE — Discharge Instructions (Signed)
 You were seen in the ER today for a medication refill. Your Depakote  has been sent to your pharmacy. Please take this as prescribed and follow up closely with your neurologist. For any concerns of new or worsening symptoms, return to the ER.

## 2024-03-02 NOTE — ED Provider Notes (Signed)
 [place] EMERGENCY DEPARTMENT AT [facility] Provider Note   CSN: [id number] Arrival date & time: 03/02/24  9048     Patient presents with: No chief complaint on file.   Alison Collins is a 53 y.o. female.  Patient with partial seizures presents emergency department with concerns of medication refill.  She reports that she ran out of her Depakote  yesterday and is unable to see a neurologist until later this month/next month.  She is requesting a refill on her Depakote .  Denies any breakthrough seizure activity.  Has no other acute concerns at this time beyond this medication refill.  HPI     Prior to Admission medications   Medication Sig Start Date End Date Taking? Authorizing Provider  divalproex  (DEPAKOTE ) 500 MG DR tablet Take 1 tablet (500 mg total) by mouth 2 (two) times daily. 03/02/24 04/01/24 Yes Arpita Fentress A, PA-C  azelastine  (OPTIVAR ) 0.05 % ophthalmic solution Place 1 drop into both eyes 2 (two) times daily. 08/20/23   Reddick, Johnathan B, NP  cephALEXin  (KEFLEX ) 500 MG capsule Take 1 capsule (500 mg total) by mouth 4 (four) times daily. 12/15/23   Armenta Canning, MD  Cyanocobalamin  (VITAMIN B 12) 500 MCG TABS Take 1 tablet by mouth daily.    [provider]  divalproex  (DEPAKOTE ) 500 MG DR tablet Take 1 tablet (500 mg total) by mouth 2 (two) times daily. 01/24/24   Billy Philippe SAUNDERS, NP  hydrochlorothiazide  (HYDRODIURIL ) 25 MG tablet Take 1 tablet (25 mg total) by mouth daily. 11/23/23   Billy Philippe SAUNDERS, NP  lisinopril  (ZESTRIL ) 20 MG tablet Take 1 tablet (20 mg total) by mouth daily. 11/23/23 11/22/24  Billy Philippe SAUNDERS, NP  Magnesium 300 MG CAPS Take 1 capsule by mouth daily at 6 (six) AM.    [provider]  metFORMIN  (GLUCOPHAGE ) 500 MG tablet Take 1 tablet (500 mg total) by mouth 2 (two) times daily with a meal. 07/09/23 08/11/23  Remi Pippin, NP  moxifloxacin  (VIGAMOX ) 0.5 % ophthalmic solution Place 1 drop into both eyes 3 (three)  times daily. 08/13/23   Rising, Asberry, PA-C  phenazopyridine  (PYRIDIUM ) 200 MG tablet Take 1 tablet (200 mg total) by mouth 3 (three) times daily as needed for pain. 12/15/23   Armenta Canning, MD    Allergies: Gluten meal and Penicillins    Review of Systems  Constitutional:        Medication refill  All other systems reviewed and are negative.   Updated Vital Signs BP (!) 143/75   Pulse 75   Temp 98.2 F (36.8 C)   Resp 16   LMP 05/16/2017 (Approximate)   SpO2 96%   Physical Exam Vitals and nursing note reviewed.  Constitutional:      General: She is not in acute distress.    Appearance: Normal appearance. She is not ill-appearing.  HENT:     Head: Normocephalic and atraumatic.  Eyes:     General:        Right eye: No discharge.        Left eye: No discharge.     Extraocular Movements: Extraocular movements intact.     Conjunctiva/sclera: Conjunctivae normal.  Cardiovascular:     Rate and Rhythm: Normal rate and regular rhythm.  Pulmonary:     Effort: Pulmonary effort is normal. No respiratory distress.     Breath sounds: Normal breath sounds.  Skin:    General: Skin is warm and dry.  Neurological:     General: No focal  deficit present.     Mental Status: She is alert and oriented to person, place, and time. Mental status is at baseline.     (all labs ordered are listed, but only abnormal results are displayed) Labs Reviewed - No data to display  EKG: None  Radiology: No results found.   Procedures   Medications Ordered in the ED - No data to display                                  Medical Decision Making Risk Prescription drug management.   This patient presents to the ED for concern of medication refill.   Problem List / ED Course:  Patient with past history significant for partial seizures presents to the emergency department with concerns of a medication refill.  She is here requesting refill on her Depakote  that she last took yesterday  evening.  She states that she is unable to see her neurologist until next month.  He is requesting a refill on Depakote  to continue with her home therapy.  No other concerns at this time.  Denies any seizure episodes. Physical exam is unremarkable. Given patient has no other acute focal concerns, will refill patient's Depakote .  Instructed to take twice daily as prescribed previously.  Encouraged close follow-up with her neurologist for further evaluation.  Return precautions discussed.  Patient discharged home in stable condition.   Social Determinants of Health:  None  Final diagnoses:  Encounter for medication refill    ED Discharge Orders          Ordered    divalproex  (DEPAKOTE ) 500 MG DR tablet  2 times daily        03/02/24 1029               [name] 03/02/24 1032    Armenta Canning, MD 03/08/24 1651

## 2024-03-02 NOTE — ED Triage Notes (Signed)
 Pt caox4, ambulatory NAD stating she ran out of her Depakote  yest. And is unable to see the neurologist until next month. Denies any acute complaint, only requesting med refill.

## 2024-03-08 ENCOUNTER — Ambulatory Visit (INDEPENDENT_AMBULATORY_CARE_PROVIDER_SITE_OTHER): Payer: Self-pay | Admitting: Student in an Organized Health Care Education/Training Program

## 2024-03-08 ENCOUNTER — Encounter: Payer: Self-pay | Admitting: Student in an Organized Health Care Education/Training Program

## 2024-03-08 VITALS — BP 156/74 | HR 67 | Ht 64.0 in | Wt 226.0 lb

## 2024-03-08 DIAGNOSIS — R569 Unspecified convulsions: Secondary | ICD-10-CM

## 2024-03-08 DIAGNOSIS — F341 Dysthymic disorder: Secondary | ICD-10-CM

## 2024-03-08 DIAGNOSIS — E119 Type 2 diabetes mellitus without complications: Secondary | ICD-10-CM

## 2024-03-08 DIAGNOSIS — L659 Nonscarring hair loss, unspecified: Secondary | ICD-10-CM

## 2024-03-08 DIAGNOSIS — G911 Obstructive hydrocephalus: Secondary | ICD-10-CM

## 2024-03-08 DIAGNOSIS — I10 Essential (primary) hypertension: Secondary | ICD-10-CM

## 2024-03-08 LAB — CBC
HCT: 39.5 % (ref 36.0–46.0)
Hemoglobin: 13 g/dL (ref 12.0–15.0)
MCHC: 33.1 g/dL (ref 30.0–36.0)
MCV: 87.9 fl (ref 78.0–100.0)
Platelets: 167 K/uL (ref 150.0–400.0)
RBC: 4.49 Mil/uL (ref 3.87–5.11)
RDW: 14.6 % (ref 11.5–15.5)
WBC: 6.5 K/uL (ref 4.0–10.5)

## 2024-03-08 LAB — LIPID PANEL
Cholesterol: 165 mg/dL (ref 0–200)
HDL: 51.7 mg/dL (ref >39.00)
LDL Cholesterol: 92 mg/dL (ref 0–99)
NonHDL: 112.85
Total CHOL/HDL Ratio: 3
Triglycerides: 104 mg/dL (ref 0.0–149.0)
VLDL: 20.8 mg/dL (ref 0.0–40.0)

## 2024-03-08 LAB — IBC + FERRITIN
Ferritin: 51 ng/mL (ref 10.0–291.0)
Iron: 89 ug/dL (ref 42–145)
Saturation Ratios: 23.5 % (ref 20.0–50.0)
TIBC: 379.4 ug/dL (ref 250.0–450.0)
Transferrin: 271 mg/dL (ref 212.0–360.0)

## 2024-03-08 LAB — HEMOGLOBIN A1C: Hgb A1c MFr Bld: 6.5 % (ref 4.6–6.5)

## 2024-03-08 NOTE — Patient Instructions (Signed)
  VISIT SUMMARY: Today, we discussed your concerns about hair loss, weight gain, and other symptoms. We reviewed your medical history, including your well-controlled seizure disorder and recent surgery for hydrocephalus. We also talked about your current medications and their possible side effects.  YOUR PLAN: -HAIR LOSS, POSSIBLE THYROID  DISORDER, AND POSSIBLE IRON DEFICIENCY: Your hair loss might be due to a thyroid  disorder or low iron levels. We will do blood tests to check your iron levels and thyroid  function. If we find that you have low iron, we will start you on iron supplements. If you have a thyroid  problem, we will begin the appropriate treatment.  -SEIZURE DISORDER: Your seizures are well-controlled with Depakote , and you haven't had a seizure for nearly a year. It's important to continue taking Depakote  as prescribed. We discussed the importance of getting health insurance to ensure you can always get your medication refills.  -HYPERTENSION: Your blood pressure is slightly elevated today, which might be due to nervousness. You are currently taking lisinopril  and hydrochlorothiazide  to manage your blood pressure. We will reassess your need for any changes to your medication in future visits.  -DEPRESSION: You mentioned feeling depressed, which might be related to family stress. We will continue to monitor your mood and discuss any further steps if needed.  -EDEMA OF LOWER EXTREMITIES: You have mild swelling in your legs and feet. We will keep an eye on this and investigate further if it persists or worsens.  -GENERAL HEALTH MAINTENANCE: We talked about the importance of getting health insurance to help you access medications and other health services. This will be especially important for managing your weight and overall health.  INSTRUCTIONS: Please follow up after your blood work results are available. We will contact you with the results and discuss the next steps based on the  findings.

## 2024-03-08 NOTE — Progress Notes (Signed)
 Established Patient Office Visit  Subjective   Patient ID: Alison Collins, female    DOB: January 30, 1971  Age: 53 y.o. MRN: [id number]  Chief Complaint  Patient presents with   Establish Care    Patient would like referral to GYN and gastro for mammo and to get a colonoscopy    Hair/Scalp Problem    Hair is falling out often   Obesity    Patient is interested in losing weight.     HPI  Discussed the use of AI scribe software for clinical note transcription with the patient, who gave verbal consent to proceed.  History of Present Illness Alison Collins is a 53 year old female with seizures and hydrocephalus who presents with hair loss and weight gain. She is accompanied by her husband.  She experiences significant hair loss, which she suspects may be related to her medication. Her hair falls out in the shower, and she is concerned about the amount of hair loss. She is currently on Depakote  for seizures and also takes lisinopril  and hydrochlorothiazide  for high blood pressure.  She has a history of seizures for at least nine years and has been seizure-free for nearly a year since taking Depakote  regularly. She underwent surgery for hydrocephalus in December or January, performed by Dr. Debby, which involved a new type of procedure rather than a traditional shunt placement. She experiences occasional scalp pain.  She mentions gaining weight, although the exact amount is unclear. She attributes some of this to her medication and notes that her clothes fit differently. No recent steroid use. She is unsure about any thyroid  issues, although there is a family history of thyroid  problems.  She reports symptoms of allergies, including a runny nose and a sensation of having 'a bug in my eyes.' She also experiences swelling in her legs and feet.  Socially, she is married and her husband is the sole earner. She does not currently have insurance, which limits her access to a neurologist. She feels  depressed, partly due to family issues, such as not seeing her grandchildren.      Objective:     BP (!) 156/74 (BP Location: Right Arm, Cuff Size: Large)   Pulse 67   Ht 5' 4 (1.626 m)   Wt 226 lb (102.5 kg)   LMP 05/16/2017 (Approximate)   SpO2 95%   BMI 38.79 kg/m    Physical Exam  Gen: Well-appearing woman Skin: On her scalp there is no scarring, no erythema.  Mild thinning of the hair on the crown.  Pull test was essentially normal. Neck: Normal feeling thyroid , no nodules, submandibular salivary glands feel more prominence, no other cervical adenopathy felt Heart: Regular, no murmur Lungs: Unlabored, clear throughout Ext: Warm, 1+ pitting edema in both lower extremities, normal joints Psych: Mildly depressed appearing, normal speech, organized thoughts    Assessment & Plan:    Problem List Items Addressed This Visit       High   Non-insulin  dependent type 2 diabetes mellitus (HCC) (Chronic)   History of diabetes diagnosed around 2024 with A1c's around 6.8%.  Not currently using any medications.  Has noticed weight gain.  At high risk for metabolic syndrome.  Will recheck A1c today, and I anticipate wanting to start medications in the near future.      Relevant Orders   Lipid panel   Hemoglobin A1c   Seizures (HCC) (Chronic)     Medium    Hypertension - Primary (Chronic)   History of hypertension  associated with diabetes and obesity.  Currently using lisinopril  20 mg daily and hydrochlorothiazide  25 mg daily.  I think there are access issues due to no insurance.  Last fill of the medications was about 4 months ago and her bottles are still half-full.  I think I want to start by working on adherence to the medicines, before adjusting dosages.  Follow-up closely in 3 months.      Depression (Chronic)   Chronic mood disorder.  Situational issues with family stress.  Not currently on antidepressants.  Starting to become function limiting.  Will talk with her more  in the future about possibilities of starting antidepressant medications.  For now I will work on her other medical problems which may be contributing to mood issues.        Low   Hydrocephalus (HCC) (Chronic)   History of hydrocephalus diagnosed around 2024.  Became symptomatic.  In January she had a hospitalization where she underwent endoscopic third ventriculostomy with neurosurgery.  Clinically doing well but has lacked in follow-up with neurosurgery due to insurance issues.        Unprioritized   Hair loss   Hair loss may be linked to thyroid  disorder or iron deficiency. Differential diagnosis includes low iron levels or thyroid  dysfunction. Order blood work to assess iron levels and thyroid  function. Initiate iron supplementation if iron deficiency is confirmed. Initiate appropriate thyroid  treatment if thyroid  dysfunction is confirmed.  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  No signs of scarring dermatologic scalp disorder.      Relevant Orders   CBC   IBC + Ferritin    Return in about 3 months (around 06/08/2024).    Cleatus Debby Specking, MD

## 2024-03-08 NOTE — Assessment & Plan Note (Signed)
 Chronic mood disorder.  Situational issues with family stress.  Not currently on antidepressants.  Starting to become function limiting.  Will talk with her more in the future about possibilities of starting antidepressant medications.  For now I will work on her other medical problems which may be contributing to mood issues.

## 2024-03-08 NOTE — Assessment & Plan Note (Signed)
 History of hypertension associated with diabetes and obesity.  Currently using lisinopril  20 mg daily and hydrochlorothiazide  25 mg daily.  I think there are access issues due to no insurance.  Last fill of the medications was about 4 months ago and her bottles are still half-full.  I think I want to start by working on adherence to the medicines, before adjusting dosages.  Follow-up closely in 3 months.

## 2024-03-08 NOTE — Assessment & Plan Note (Signed)
 Hair loss may be linked to thyroid  disorder or iron deficiency. Differential diagnosis includes low iron levels or thyroid  dysfunction. Order blood work to assess iron levels and thyroid  function. Initiate iron supplementation if iron deficiency is confirmed. Initiate appropriate thyroid  treatment if thyroid  dysfunction is confirmed.  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  No signs of scarring dermatologic scalp disorder.

## 2024-03-08 NOTE — Assessment & Plan Note (Signed)
 History of diabetes diagnosed around 2024 with A1c's around 6.8%.  Not currently using any medications.  Has noticed weight gain.  At high risk for metabolic syndrome.  Will recheck A1c today, and I anticipate wanting to start medications in the near future.

## 2024-03-08 NOTE — Assessment & Plan Note (Signed)
 History of hydrocephalus diagnosed around 2024.  Became symptomatic.  In January she had a hospitalization where she underwent endoscopic third ventriculostomy with neurosurgery.  Clinically doing well but has lacked in follow-up with neurosurgery due to insurance issues.

## 2024-03-09 ENCOUNTER — Ambulatory Visit: Payer: Self-pay | Admitting: Student in an Organized Health Care Education/Training Program

## 2024-03-09 DIAGNOSIS — L659 Nonscarring hair loss, unspecified: Secondary | ICD-10-CM

## 2024-03-12 NOTE — Progress Notes (Signed)
 Called patient to relay results and schedule lab only visit. Lvm to return call

## 2024-03-14 NOTE — Progress Notes (Signed)
 Patient verbalized lab results  Will call back to schedule lab appt  Future lab already ordered

## 2024-03-16 ENCOUNTER — Telehealth: Payer: Self-pay

## 2024-03-16 DIAGNOSIS — Z1231 Encounter for screening mammogram for malignant neoplasm of breast: Secondary | ICD-10-CM

## 2024-03-16 NOTE — Telephone Encounter (Signed)
 Copied from CRM [id number]. Topic: Clinical - Request for Lab/Test Order >> Mar 16, 2024  3:28 PM Alison Collins wrote: Reason for CRM: Patients husband stated patient is wanting mammogram and that it was mentioned to Dr. Jerrell. Please call for further information

## 2024-03-16 NOTE — Telephone Encounter (Signed)
 Patients husband is wondering if you ordered mammo? I did not see it in orders.

## 2024-03-16 NOTE — Addendum Note (Signed)
 Addended by: JERRELL SOLIAN T on: 03/16/2024 04:45 PM   Modules accepted: Orders

## 2024-03-16 NOTE — Telephone Encounter (Signed)
Screening mammogram ordered. Thank you!

## 2024-03-19 NOTE — Telephone Encounter (Signed)
Called patient and she verbalized understanding  °

## 2024-03-30 ENCOUNTER — Other Ambulatory Visit: Payer: Self-pay

## 2024-03-30 ENCOUNTER — Other Ambulatory Visit (INDEPENDENT_AMBULATORY_CARE_PROVIDER_SITE_OTHER): Payer: Self-pay

## 2024-03-30 ENCOUNTER — Telehealth: Payer: Self-pay | Admitting: Student in an Organized Health Care Education/Training Program

## 2024-03-30 DIAGNOSIS — Z760 Encounter for issue of repeat prescription: Secondary | ICD-10-CM

## 2024-03-30 DIAGNOSIS — L659 Nonscarring hair loss, unspecified: Secondary | ICD-10-CM

## 2024-03-30 LAB — TSH: TSH: 4.53 u[IU]/mL (ref 0.35–5.50)

## 2024-03-30 MED ORDER — DIVALPROEX SODIUM 500 MG PO DR TAB: 500.0000 mg | DELAYED_RELEASE_TABLET | Freq: Two times a day (BID) | ORAL | 0 refills | Status: DC

## 2024-03-30 MED ORDER — LISINOPRIL 20 MG PO TABS: 20.0000 mg | ORAL_TABLET | Freq: Every day | ORAL | 0 refills | Status: AC

## 2024-03-30 MED ORDER — HYDROCHLOROTHIAZIDE 25 MG PO TABS: 25.0000 mg | ORAL_TABLET | Freq: Every day | ORAL | 0 refills | Status: AC

## 2024-03-30 NOTE — Telephone Encounter (Signed)
 Medication sent to pharmacy

## 2024-03-30 NOTE — Telephone Encounter (Signed)
 Encourage patient to contact the pharmacy for refills or they can request refills through [facility]  (Please schedule appointment if patient has not been seen in over a year)    WHAT PHARMACY WOULD THEY LIKE THIS SENT TO:  [facility] - [place], [place] - [address]   MEDICATION NAME & DOSE: divalproex  (DEPAKOTE ) 500 MG DR tablet  hydrochlorothiazide  (HYDRODIURIL ) 25 MG tablet  lisinopril  (ZESTRIL ) 20 MG tablet   NOTES/COMMENTS FROM PATIENT: Patient stated they are moving to [place]  due to a job change November 8th and have requested these medications to be refilled to give them enough time to get established with a provider there     Front office please notify patient: It takes 48-72 hours to process rx refill requests Ask patient to call pharmacy to ensure rx is ready before heading there.

## 2024-04-02 ENCOUNTER — Ambulatory Visit: Payer: Self-pay | Admitting: Student in an Organized Health Care Education/Training Program

## 2024-04-09 ENCOUNTER — Telehealth: Payer: Self-pay

## 2024-04-09 ENCOUNTER — Telehealth: Payer: Self-pay | Admitting: Student in an Organized Health Care Education/Training Program

## 2024-04-09 NOTE — Telephone Encounter (Signed)
 Copied from CRM #[id number]. Topic: General - Call Back - No Documentation >> Apr 09, 2024  4:30 PM Alfonso ORN wrote: Reason for CRM: pt moving wants to ensure time to find new pcp and requesting refill on her medication prior to leaving. Request submitted.

## 2024-04-10 NOTE — Telephone Encounter (Signed)
 I do not see documentation of which medications patient is needing. Called patient and left vm to return call

## 2024-04-11 NOTE — Telephone Encounter (Signed)
 Patient had a recent fill of medications on 03/30/24 with refills. Called patient and left vm

## 2024-04-23 ENCOUNTER — Other Ambulatory Visit: Payer: Self-pay | Admitting: Student in an Organized Health Care Education/Training Program

## 2024-04-23 DIAGNOSIS — Z760 Encounter for issue of repeat prescription: Secondary | ICD-10-CM

## 2024-06-11 ENCOUNTER — Ambulatory Visit: Payer: Self-pay | Admitting: Student in an Organized Health Care Education/Training Program
